# Patient Record
Sex: Female | Born: 1970 | Race: White | Hispanic: No | Marital: Married | Smoking: Never smoker
Health system: Southern US, Community
[De-identification: ages and names within clinical notes are randomized; demographics above are authoritative.]

## PROBLEM LIST (undated history)

## (undated) DIAGNOSIS — M069 Rheumatoid arthritis, unspecified: Secondary | ICD-10-CM

## (undated) DIAGNOSIS — E669 Obesity, unspecified: Secondary | ICD-10-CM

## (undated) DIAGNOSIS — E785 Hyperlipidemia, unspecified: Secondary | ICD-10-CM

## (undated) DIAGNOSIS — I10 Essential (primary) hypertension: Secondary | ICD-10-CM

## (undated) DIAGNOSIS — K219 Gastro-esophageal reflux disease without esophagitis: Secondary | ICD-10-CM

## (undated) HISTORY — DX: Essential (primary) hypertension: I10

## (undated) HISTORY — PX: OTHER SURGICAL HISTORY: SHX169

## (undated) HISTORY — PX: TONSILLECTOMY: SUR1361

## (undated) HISTORY — DX: Hyperlipidemia, unspecified: E78.5

## (undated) HISTORY — DX: Obesity, unspecified: E66.9

## (undated) HISTORY — DX: Rheumatoid arthritis, unspecified: M06.9

---

## 2013-03-09 ENCOUNTER — Ambulatory Visit: Payer: Self-pay | Admitting: Adult Health

## 2013-10-13 ENCOUNTER — Ambulatory Visit: Payer: Self-pay | Admitting: Family Medicine

## 2013-11-27 DIAGNOSIS — K219 Gastro-esophageal reflux disease without esophagitis: Secondary | ICD-10-CM | POA: Insufficient documentation

## 2013-12-07 ENCOUNTER — Ambulatory Visit: Payer: Self-pay | Admitting: Podiatry

## 2013-12-11 ENCOUNTER — Ambulatory Visit: Payer: Self-pay | Admitting: Podiatry

## 2014-02-12 DIAGNOSIS — H353190 Nonexudative age-related macular degeneration, unspecified eye, stage unspecified: Secondary | ICD-10-CM | POA: Insufficient documentation

## 2014-07-19 LAB — HM PAP SMEAR: HM PAP: NORMAL

## 2014-12-30 ENCOUNTER — Other Ambulatory Visit: Payer: Self-pay | Admitting: Family Medicine

## 2014-12-30 ENCOUNTER — Other Ambulatory Visit: Payer: Self-pay | Admitting: Obstetrics and Gynecology

## 2014-12-30 DIAGNOSIS — Z1231 Encounter for screening mammogram for malignant neoplasm of breast: Secondary | ICD-10-CM

## 2015-01-05 ENCOUNTER — Ambulatory Visit
Admission: RE | Admit: 2015-01-05 | Discharge: 2015-01-05 | Disposition: A | Discharge status: Home / Self Care | Payer: Managed Care, Other (non HMO) | Source: Ambulatory Visit | Attending: Obstetrics and Gynecology | Admitting: Obstetrics and Gynecology

## 2015-01-05 DIAGNOSIS — Z1231 Encounter for screening mammogram for malignant neoplasm of breast: Secondary | ICD-10-CM

## 2015-04-19 ENCOUNTER — Encounter: Payer: Self-pay | Admitting: Obstetrics and Gynecology

## 2015-04-19 ENCOUNTER — Ambulatory Visit (INDEPENDENT_AMBULATORY_CARE_PROVIDER_SITE_OTHER): Payer: Managed Care, Other (non HMO) | Admitting: Obstetrics and Gynecology

## 2015-04-19 MED ORDER — CYANOCOBALAMIN 1000 MCG/ML IJ SOLN: 1000.0000 ug | Freq: Once | INTRAMUSCULAR | Status: DC

## 2015-04-19 MED ORDER — PHENTERMINE HCL 37.5 MG PO TABS: 37.5000 mg | ORAL_TABLET | Freq: Every day | ORAL | Status: DC

## 2015-04-19 NOTE — Progress Notes (Signed)
Patient ID: Victoria Berry, female   DOB: 07-15-1970, 44 y.o.   MRN: [id number]  Subjective:  Victoria Berry is a 44 y.o. G1P0 at Unknown being seen today for weight loss management- initial visit.  Patient reports General ROS: fatigue secondary to RA and reports previous weight loss attempts: weight watchers and exercise (has gym membership)  The following portions of the patient's history were reviewed and updated as appropriate: allergies, current medications, past family history, past medical history, past social history, past surgical history and problem list.   Objective:   Filed Vitals:   04/19/15 0917  BP: 132/83  Pulse: 84  Height: 5' (1.524 m)  Weight: 248 lb 6.4 oz (112.674 kg)    General:  Alert, oriented and cooperative. Patient is in no acute distress.  :   :   :   :   :   :   PE: Well groomed female in no current distress,   Mental Status: Normal mood and affect. Normal behavior. Normal judgment and thought content.   Current BMI: Body mass index is 48.51 kg/(m^2).   Assessment and Plan:  Obesity  There are no diagnoses linked to this encounter.  Plan: low carb, High protein diet RX for adipex 37.5 mg daily and B12 .ml monthly, to start now with first injection given at today's visit. Reviewed side-effects Berry to both medications and expected outcomes. Increase daily water intake to at least 8 bottle a day, every day.  Goal is to reduse weight by 10% by end of three months, and will re-evaluate then.  RTC in 4 weeks for Nurse visit to check weight & BP, and get next B12 injections.    Please refer to After Visit Summary for other counseling recommendations.    Camani Sesay Suzan Nailer, CNM    Consider the Low Glycemic Index Diet and 6 smaller meals daily .  This boosts your metabolism and regulates your sugars:   Use the protein bar by [employer] because they have lots of fiber in them  Find the low carb flatbreads, tortillas  and pita breads for sandwiches:  Joseph's makes a pita bread and a flat bread , available at [facility] and [employer]; Toufayah makes a low carb flatbread available at [employer] and [employer] that is 9 net carbs and 100 cal Mission makes a low carb whole wheat tortilla available at [employer] most grocery stores with 6 net carbs and 210 cal  [place] yogurt can still have a lot of carbs .  Dannon Light N fit has 80 cal and 8 carbs

## 2015-04-19 NOTE — Patient Instructions (Signed)

## 2015-04-20 DIAGNOSIS — E669 Obesity, unspecified: Secondary | ICD-10-CM | POA: Insufficient documentation

## 2015-04-20 DIAGNOSIS — M0609 Rheumatoid arthritis without rheumatoid factor, multiple sites: Secondary | ICD-10-CM | POA: Insufficient documentation

## 2015-04-20 DIAGNOSIS — Z79899 Other long term (current) drug therapy: Secondary | ICD-10-CM | POA: Insufficient documentation

## 2015-05-20 ENCOUNTER — Ambulatory Visit (INDEPENDENT_AMBULATORY_CARE_PROVIDER_SITE_OTHER): Payer: Managed Care, Other (non HMO) | Admitting: Obstetrics and Gynecology

## 2015-05-20 VITALS — BP 153/82 | HR 84 | Ht 60.0 in | Wt 238.9 lb

## 2015-05-20 DIAGNOSIS — R5383 Other fatigue: Secondary | ICD-10-CM

## 2015-05-20 DIAGNOSIS — E669 Obesity, unspecified: Secondary | ICD-10-CM

## 2015-05-20 MED ORDER — CYANOCOBALAMIN 1000 MCG/ML IJ SOLN
1000.0000 ug | Freq: Once | INTRAMUSCULAR | Status: AC
  Administered 2015-05-20: 1000 ug via INTRAMUSCULAR

## 2015-05-20 NOTE — Progress Notes (Cosign Needed)
Pt presents for weight management with adipex. Pt is here for BP check and injection of B12. Pt today with a weight loss of 10lbs. Pt states she is exercising 2-3 times per week, although working to increase that. Pt states that she has been eating clean (no dairy or red meat). Pt states she has better energy and is now sleeping well after some initial difficulty falling asleep. Pt does c/o worsening GERD, pt states she is on Nexium currently for reflux. Pt also notes that she is having a colonoscopy and endoscopy on March 3rd and wishes to discontinue the apidpex the week before her procedures. Advised pt that I would inform [facility]. Pt also with elevated BP today, notes that she was rushing to get here. Will monitor.

## 2015-05-20 NOTE — Patient Instructions (Signed)
Pt to f/u in 1 month for wt, bp, b12

## 2015-06-17 ENCOUNTER — Ambulatory Visit (INDEPENDENT_AMBULATORY_CARE_PROVIDER_SITE_OTHER): Payer: Managed Care, Other (non HMO) | Admitting: Obstetrics and Gynecology

## 2015-06-17 VITALS — BP 140/81 | HR 74 | Ht 60.0 in | Wt 236.9 lb

## 2015-06-17 DIAGNOSIS — R5383 Other fatigue: Secondary | ICD-10-CM

## 2015-06-17 DIAGNOSIS — E669 Obesity, unspecified: Secondary | ICD-10-CM | POA: Diagnosis not present

## 2015-06-17 MED ORDER — CYANOCOBALAMIN 1000 MCG/ML IJ SOLN
1000.0000 ug | Freq: Once | INTRAMUSCULAR | Status: AC
  Administered 2015-06-17: 1000 ug via INTRAMUSCULAR

## 2015-06-17 NOTE — Patient Instructions (Signed)
FU in 1 month

## 2015-06-17 NOTE — Progress Notes (Cosign Needed)
Pt presents for Weight, BP, and B12 injection. Pt today with a weight loss of 2lbs. Pt notes that she has only been taking half of the Phentermine tablet, due to her upcoming endoscopy (feels as though the medication may contribute to her not having accurate results from endoscopy). Pt states that the Phentermine has been aggravating her GERD and has made the symptoms worse, pt is hopeful that a half tablet will help with the GERD symptoms but questions if she will still see results only taking half tablet. Pt is schedule for Endoscopy with [facility] 06/24/2015. Pt to return in 1 month, after endoscopy results. Pt given 35mL injection of B12, pt tolerated well.

## 2015-06-23 ENCOUNTER — Encounter: Payer: Self-pay | Admitting: *Deleted

## 2015-06-24 ENCOUNTER — Encounter: Payer: Self-pay | Admitting: *Deleted

## 2015-06-24 ENCOUNTER — Ambulatory Visit
Admission: RE | Admit: 2015-06-24 | Discharge: 2015-06-24 | Disposition: A | Discharge status: Home / Self Care | Payer: Managed Care, Other (non HMO) | Source: Ambulatory Visit | Attending: Gastroenterology | Admitting: Gastroenterology

## 2015-06-24 ENCOUNTER — Ambulatory Visit: Payer: Managed Care, Other (non HMO) | Admitting: Anesthesiology

## 2015-06-24 ENCOUNTER — Encounter
Admission: RE | Disposition: A | Discharge status: Home / Self Care | Payer: Self-pay | Source: Ambulatory Visit | Attending: Gastroenterology

## 2015-06-24 DIAGNOSIS — K2970 Gastritis, unspecified, without bleeding: Secondary | ICD-10-CM | POA: Insufficient documentation

## 2015-06-24 DIAGNOSIS — E669 Obesity, unspecified: Secondary | ICD-10-CM | POA: Insufficient documentation

## 2015-06-24 DIAGNOSIS — Z79899 Other long term (current) drug therapy: Secondary | ICD-10-CM | POA: Diagnosis not present

## 2015-06-24 DIAGNOSIS — K644 Residual hemorrhoidal skin tags: Secondary | ICD-10-CM | POA: Diagnosis not present

## 2015-06-24 DIAGNOSIS — Z1211 Encounter for screening for malignant neoplasm of colon: Secondary | ICD-10-CM | POA: Diagnosis present

## 2015-06-24 DIAGNOSIS — M069 Rheumatoid arthritis, unspecified: Secondary | ICD-10-CM | POA: Diagnosis not present

## 2015-06-24 DIAGNOSIS — K219 Gastro-esophageal reflux disease without esophagitis: Secondary | ICD-10-CM | POA: Diagnosis not present

## 2015-06-24 DIAGNOSIS — Z880 Allergy status to penicillin: Secondary | ICD-10-CM | POA: Diagnosis not present

## 2015-06-24 DIAGNOSIS — M1990 Unspecified osteoarthritis, unspecified site: Secondary | ICD-10-CM | POA: Insufficient documentation

## 2015-06-24 DIAGNOSIS — Z8371 Family history of colonic polyps: Secondary | ICD-10-CM | POA: Diagnosis not present

## 2015-06-24 HISTORY — DX: Gastro-esophageal reflux disease without esophagitis: K21.9

## 2015-06-24 HISTORY — PX: COLONOSCOPY WITH PROPOFOL: SHX5780

## 2015-06-24 HISTORY — PX: ESOPHAGOGASTRODUODENOSCOPY (EGD) WITH PROPOFOL: SHX5813

## 2015-06-24 SURGERY — COLONOSCOPY WITH PROPOFOL
Anesthesia: General

## 2015-06-24 MED ORDER — PROPOFOL 10 MG/ML IV BOLUS
INTRAVENOUS | Status: DC | PRN
  Administered 2015-06-24: 100 mg via INTRAVENOUS

## 2015-06-24 MED ORDER — FENTANYL CITRATE (PF) 100 MCG/2ML IJ SOLN
INTRAMUSCULAR | Status: DC | PRN
  Administered 2015-06-24: 50 ug via INTRAVENOUS

## 2015-06-24 MED ORDER — LIDOCAINE HCL (CARDIAC) 20 MG/ML IV SOLN
INTRAVENOUS | Status: DC | PRN
  Administered 2015-06-24: 40 mg via INTRAVENOUS

## 2015-06-24 MED ORDER — PROPOFOL 500 MG/50ML IV EMUL
INTRAVENOUS | Status: DC | PRN
  Administered 2015-06-24: 175 ug/kg/min via INTRAVENOUS

## 2015-06-24 MED ORDER — MIDAZOLAM HCL 2 MG/2ML IJ SOLN
INTRAMUSCULAR | Status: DC | PRN
  Administered 2015-06-24: 1 mg via INTRAVENOUS

## 2015-06-24 MED ORDER — SODIUM CHLORIDE 0.9 % IV SOLN
INTRAVENOUS | Status: DC
  Administered 2015-06-24: 1000 mL via INTRAVENOUS

## 2015-06-24 NOTE — H&P (Signed)
  Primary Care Physician:  WHITE, Arlyss Repress, NP  Pre-Procedure History & Physical: HPI:  Victoria Berry is a 45 y.o. female is here for an endoscopy / colonoscopy   Past Medical History  Diagnosis Date  . RA (rheumatoid arthritis) (HCC)   . Obesity   . GERD (gastroesophageal reflux disease)     Past Surgical History  Procedure Laterality Date  . Cesarean section  2000    twins  . Tonsillectomy    . Right ureter      Prior to Admission medications   Medication Sig Start Date End Date Taking? Authorizing Provider  cetirizine (ZYRTEC) 10 MG tablet Take 10 mg by mouth daily. As needed for allergies   Yes Historical Provider, MD  omeprazole (PRILOSEC) 40 MG capsule Take 40 mg by mouth daily.   Yes Historical Provider, MD  cholecalciferol (VITAMIN D) 1000 UNITS tablet Take 1,000 Units by mouth daily.    Historical Provider, MD  cyanocobalamin (,VITAMIN B-12,) 1000 MCG/ML injection Inject 1 mL (1,000 mcg total) into the muscle once. 04/19/15   Melody N Shambley, CNM  esomeprazole (NEXIUM) 40 MG capsule Take 40 mg by mouth daily at 12 noon.    Historical Provider, MD  hydroxychloroquine (PLAQUENIL) 200 MG tablet Take 200 mg by mouth 2 (two) times daily.    Historical Provider, MD  Omega-3 Fatty Acids (FISH OIL) 1000 MG CAPS Take by mouth.    Historical Provider, MD  phentermine (ADIPEX-P) 37.5 MG tablet Take 1 tablet (37.5 mg total) by mouth daily before breakfast. 04/19/15   Melody N Shambley, CNM  polyethylene glycol (MIRALAX / GLYCOLAX) packet Take 17 g by mouth daily.    Historical Provider, MD  polyethylene glycol-electrolytes (NULYTELY/GOLYTELY) 420 g solution Take by mouth. 05/18/15   Historical Provider, MD    Allergies as of 06/15/2015 - Review Complete 05/20/2015  Allergen Reaction Noted  . Penicillins Nausea And Vomiting 04/19/2015    Family History  Problem Relation Age of Onset  . Breast cancer Neg Hx   . Rheum arthritis Mother     Social History    Social History  . Marital Status: Married    Spouse Name: N/A  . Number of Children: N/A  . Years of Education: N/A   Occupational History  . Not on file.   Social History Main Topics  . Smoking status: Never Smoker   . Smokeless tobacco: Never Used  . Alcohol Use: No  . Drug Use: No  . Sexual Activity: Yes    Birth Control/ Protection: Condom   Other Topics Concern  . Not on file   Social History Narrative     Physical Exam: BP 154/83 mmHg  Pulse 86  Temp(Src) 97 F (36.1 C) (Oral)  Resp 16  SpO2 100% General:   Alert,  pleasant and cooperative in NAD Head:  Normocephalic and atraumatic. Neck:  Supple; no masses or thyromegaly. Lungs:  Clear throughout to auscultation.    Heart:  Regular rate and rhythm. Abdomen:  Soft, nontender and nondistended. Normal bowel sounds, without guarding, and without rebound.   Neurologic:  Alert and  oriented x4;  grossly normal neurologically.  Impression/Plan: Victoria Berry is here for an endoscopy to be performed for GERD, hx esopohagitis,  Colon for screening, + fam hx polyps  Risks, benefits, limitations, and alternatives regarding  Endoscopy/colonoscopy have been reviewed with the patient.  Questions have been answered.  All parties agreeable.   Elnita Maxwell, MD  06/24/2015, 9:28 AM

## 2015-06-24 NOTE — Anesthesia Postprocedure Evaluation (Signed)
Anesthesia Post Note  Patient: Kailynn Luellen-Conner  Procedure(s) Performed: Procedure(s) (LRB): COLONOSCOPY WITH PROPOFOL (N/A) ESOPHAGOGASTRODUODENOSCOPY (EGD) WITH PROPOFOL (N/A)  Patient location during evaluation: Endoscopy Anesthesia Type: General Level of consciousness: awake and alert Pain management: pain level controlled Vital Signs Assessment: post-procedure vital signs reviewed and stable Respiratory status: spontaneous breathing, nonlabored ventilation, respiratory function stable and patient connected to nasal cannula oxygen Cardiovascular status: blood pressure returned to baseline and stable Postop Assessment: no signs of nausea or vomiting Anesthetic complications: no    Last Vitals:  Filed Vitals:   06/24/15 1030 06/24/15 1040  BP: 111/70 118/65  Pulse: 78 74  Temp:    Resp: 18 17    Last Pain: There were no vitals filed for this visit.               Cleda Mccreedy Shateria Paternostro

## 2015-06-24 NOTE — Discharge Instructions (Signed)

## 2015-06-24 NOTE — Transfer of Care (Signed)
Immediate Anesthesia Transfer of Care Note  Patient: Victoria Berry  Procedure(s) Performed: Procedure(s): COLONOSCOPY WITH PROPOFOL (N/A) ESOPHAGOGASTRODUODENOSCOPY (EGD) WITH PROPOFOL (N/A)  Patient Location: PACU and Endoscopy Unit  Anesthesia Type:General  Level of Consciousness: sedated  Airway & Oxygen Therapy: Patient Spontanous Breathing and Patient connected to nasal cannula oxygen  Post-op Assessment: Report given to RN and Post -op Vital signs reviewed and stable  Post vital signs: Reviewed and stable  Last Vitals:  Filed Vitals:   06/24/15 1014 06/24/15 1015  BP:  108/64  Pulse:  88  Temp: 36.2 C 36.2 C  Resp:  15    Complications: No apparent anesthesia complications

## 2015-06-24 NOTE — Op Note (Signed)
[facility] Gastroenterology Patient Name: Victoria Berry Procedure Date: 06/24/2015 9:23 AM MRN: [id number] Account #: [id number] Date of Birth: 09/22/1970 Admit Type: Outpatient Age: 45 Room: [facility] ENDO ROOM 2 Gender: Female Note Status: Finalized Procedure:            Colonoscopy Indications:          Colon cancer screening in patient at increased risk:                        Family history of 1st-degree relative with colon polyps                        before age 29 years(father), , Last colonoscopy 7 years                        ago Patient Profile:      This is a 45 year old female. Providers:            Rhona Raider. Shelle Iron, MD Referring MD:         Myrene Galas, MD (Referring MD) Medicines:            Propofol per Anesthesia Complications:        No immediate complications. Procedure:            Pre-Anesthesia Assessment:                       - Prior to the procedure, a History and Physical was                        performed, and patient medications, allergies and                        sensitivities were reviewed. The patient's tolerance of                        previous anesthesia was reviewed.                       After obtaining informed consent, the colonoscope was                        passed under direct vision. Throughout the procedure,                        the patient's blood pressure, pulse, and oxygen                        saturations were monitored continuously. The                        Colonoscope was introduced through the anus and                        advanced to the the cecum, identified by appendiceal                        orifice and ileocecal valve. The colonoscopy was                        performed without difficulty. The patient tolerated the  procedure well. The quality of the bowel preparation                        was good. Findings:      The perianal exam findings include non-thrombosed  external hemorrhoids.      The exam was otherwise without abnormality. Impression:           - Non-thrombosed external hemorrhoids found on perianal                        exam.                       - The examination was otherwise normal.                       - No specimens collected. Recommendation:       - Observe patient in GI recovery unit.                       - High fiber diet.                       - Continue present medications.                       - Repeat colonoscopy in 5 years for screening purposes.                       - Return to referring physician.                       - The findings and recommendations were discussed with                        the patient.                       - The findings and recommendations were discussed with                        the patient's family. Procedure Code(s):    --- Professional ---                       [phone], Colonoscopy, flexible; diagnostic, including                        collection of specimen(s) by brushing or washing, when                        performed (separate procedure) Diagnosis Code(s):    --- Professional ---                       Z83.71, Family history of colonic polyps                       K64.4, Residual hemorrhoidal skin tags CPT copyright 2016 American Medical Association. All rights reserved. The codes documented in this report are preliminary and upon coder review may  be revised to meet current compliance requirements. Kathalene Frames, MD 06/24/2015 10:11:02 AM This report has been signed electronically. Number of Addenda: 0 Note Initiated On: 06/24/2015 9:23 AM Scope Withdrawal  Time: 0 hours 11 minutes 52 seconds  Total Procedure Duration: 0 hours 22 minutes 25 seconds       [facility]

## 2015-06-24 NOTE — Anesthesia Preprocedure Evaluation (Signed)
Anesthesia Evaluation  Patient identified by MRN, date of birth, ID band Patient awake    Reviewed: Allergy & Precautions, H&P , NPO status , Patient's Chart, lab work & pertinent test results  History of Anesthesia Complications Negative for: history of anesthetic complications  Airway Mallampati: III  TM Distance: >3 FB Neck ROM: full    Dental  (+) Poor Dentition   Pulmonary neg pulmonary ROS, neg shortness of breath,    Pulmonary exam normal breath sounds clear to auscultation       Cardiovascular Exercise Tolerance: Good (-) angina(-) Past MI and (-) DOE negative cardio ROS Normal cardiovascular exam Rhythm:regular Rate:Normal     Neuro/Psych negative neurological ROS  negative psych ROS   GI/Hepatic Neg liver ROS, GERD  Controlled,  Endo/Other  negative endocrine ROS  Renal/GU negative Renal ROS  negative genitourinary   Musculoskeletal  (+) Arthritis ,   Abdominal   Peds negative pediatric ROS (+)  Hematology negative hematology ROS (+)   Anesthesia Other Findings Past Medical History:   RA (rheumatoid arthritis) (HCC)                              Obesity                                                      GERD (gastroesophageal reflux disease)                      Past Surgical History:   CESAREAN SECTION                                 2000           Comment:twins   TONSILLECTOMY                                                 right ureter                                                    Reproductive/Obstetrics negative OB ROS                             Anesthesia Physical Anesthesia Plan  ASA: III  Anesthesia Plan: General   Post-op Pain Management:    Induction:   Airway Management Planned:   Additional Equipment:   Intra-op Plan:   Post-operative Plan:   Informed Consent: I have reviewed the patients History and Physical, chart, labs and discussed  the procedure including the risks, benefits and alternatives for the proposed anesthesia with the patient or authorized representative who has indicated his/her understanding and acceptance.   Dental Advisory Given  Plan Discussed with: Anesthesiologist, CRNA and Surgeon  Anesthesia Plan Comments:         Anesthesia Quick Evaluation

## 2015-06-24 NOTE — Op Note (Signed)
[facility] Gastroenterology Patient Name: Victoria Berry Procedure Date: 06/24/2015 9:24 AM MRN: [id number] Account #: [id number] Date of Birth: 08-16-1970 Admit Type: Outpatient Age: 45 Room: [facility] ENDO ROOM 2 Gender: Female Note Status: Finalized Procedure:            Upper GI endoscopy Indications:          Heartburn, Follow-up of reflux esophagitis Patient Profile:      This is a 45 year old female. Providers:            Rhona Raider. Shelle Iron, MD Referring MD:         Courtney Paris. White, MD (Referring MD) Medicines:            Propofol per Anesthesia Complications:        No immediate complications. Estimated blood loss:                        Minimal. Procedure:            Pre-Anesthesia Assessment:                       - Prior to the procedure, a History and Physical was                        performed, and patient medications, allergies and                        sensitivities were reviewed. The patient's tolerance of                        previous anesthesia was reviewed.                       After obtaining informed consent, the endoscope was                        passed under direct vision. Throughout the procedure,                        the patient's blood pressure, pulse, and oxygen                        saturations were monitored continuously. The Endoscope                        was introduced through the mouth, and advanced to the                        second part of duodenum. The upper GI endoscopy was                        accomplished without difficulty. The patient tolerated                        the procedure well. Findings:      The Z-line was irregular. Biopsies were taken with a cold forceps for       histology. Estimated blood loss was minimal.      The stomach was normal.      The examined duodenum was normal. Impression:           - Z-line irregular. Biopsied.                       -  Normal stomach.  - Normal examined duodenum. Recommendation:       - Perform a colonoscopy today.                       - Continue present medications.                       - Await pathology results.                       - The findings and recommendations were discussed with                        the patient.                       - The findings and recommendations were discussed with                        the patient's family. Procedure Code(s):    --- Professional ---                       [phone], Esophagogastroduodenoscopy, flexible, transoral;                        with biopsy, single or multiple Diagnosis Code(s):    --- Professional ---                       K22.8, Other specified diseases of esophagus                       R12, Heartburn                       K21.0, Gastro-esophageal reflux disease with esophagitis CPT copyright 2016 American Medical Association. All rights reserved. The codes documented in this report are preliminary and upon coder review may  be revised to meet current compliance requirements. Kathalene Frames, MD 06/24/2015 9:43:44 AM This report has been signed electronically. Number of Addenda: 0 Note Initiated On: 06/24/2015 9:24 AM      [facility]

## 2015-06-24 NOTE — Anesthesia Procedure Notes (Signed)
Date/Time: 06/24/2015 9:29 AM Performed by: Stormy Fabian Pre-anesthesia Checklist: Patient identified, Emergency Drugs available, Suction available and Patient being monitored Patient Re-evaluated:Patient Re-evaluated prior to inductionOxygen Delivery Method: Nasal cannula Intubation Type: IV induction Dental Injury: Teeth and Oropharynx as per pre-operative assessment  Comments: Nasal cannula with etCO2 monitoring

## 2015-06-27 ENCOUNTER — Encounter: Payer: Self-pay | Admitting: Gastroenterology

## 2015-06-27 LAB — SURGICAL PATHOLOGY

## 2015-07-20 ENCOUNTER — Ambulatory Visit (INDEPENDENT_AMBULATORY_CARE_PROVIDER_SITE_OTHER): Payer: Managed Care, Other (non HMO) | Admitting: Obstetrics and Gynecology

## 2015-07-20 VITALS — BP 125/75 | HR 67 | Wt 229.3 lb

## 2015-07-20 DIAGNOSIS — R7982 Elevated C-reactive protein (CRP): Secondary | ICD-10-CM | POA: Insufficient documentation

## 2015-07-20 DIAGNOSIS — E669 Obesity, unspecified: Secondary | ICD-10-CM

## 2015-07-20 MED ORDER — CYANOCOBALAMIN 1000 MCG/ML IJ SOLN
1000.0000 ug | Freq: Once | INTRAMUSCULAR | Status: AC
  Administered 2015-07-20: 1000 ug via INTRAMUSCULAR

## 2015-07-20 NOTE — Progress Notes (Signed)
Patient ID: Victoria Berry, female   DOB: 12-Dec-1970, 45 y.o.   MRN: [id number] Pt presents for weight, B/P, B-12 injection. No side effects of medication-Phentermine, or B-12.  Weight loss of 7 lbs. Encouraged eating healthy and exercise. Pt will see provider next visit. She has been only taking 1/2 tab and some days none.

## 2015-08-24 ENCOUNTER — Ambulatory Visit: Payer: Managed Care, Other (non HMO) | Admitting: Obstetrics and Gynecology

## 2015-11-07 ENCOUNTER — Other Ambulatory Visit: Payer: Self-pay | Admitting: Unknown Physician Specialty

## 2015-11-07 ENCOUNTER — Ambulatory Visit
Admission: RE | Admit: 2015-11-07 | Discharge: 2015-11-07 | Disposition: A | Discharge status: Home / Self Care | Payer: Managed Care, Other (non HMO) | Source: Ambulatory Visit | Attending: Unknown Physician Specialty | Admitting: Unknown Physician Specialty

## 2015-11-07 DIAGNOSIS — R1011 Right upper quadrant pain: Secondary | ICD-10-CM

## 2015-11-07 DIAGNOSIS — N281 Cyst of kidney, acquired: Secondary | ICD-10-CM | POA: Diagnosis not present

## 2015-11-09 ENCOUNTER — Ambulatory Visit
Admission: RE | Admit: 2015-11-09 | Discharge: 2015-11-09 | Disposition: A | Discharge status: Home / Self Care | Payer: Managed Care, Other (non HMO) | Source: Ambulatory Visit | Attending: Unknown Physician Specialty | Admitting: Unknown Physician Specialty

## 2015-11-09 ENCOUNTER — Other Ambulatory Visit: Payer: Self-pay | Admitting: Unknown Physician Specialty

## 2015-11-09 DIAGNOSIS — R079 Chest pain, unspecified: Secondary | ICD-10-CM | POA: Diagnosis present

## 2015-11-09 DIAGNOSIS — R918 Other nonspecific abnormal finding of lung field: Secondary | ICD-10-CM | POA: Insufficient documentation

## 2016-02-06 ENCOUNTER — Other Ambulatory Visit: Payer: Self-pay | Admitting: Family Medicine

## 2016-02-06 DIAGNOSIS — Z1231 Encounter for screening mammogram for malignant neoplasm of breast: Secondary | ICD-10-CM

## 2016-03-07 ENCOUNTER — Encounter (INDEPENDENT_AMBULATORY_CARE_PROVIDER_SITE_OTHER): Payer: Self-pay

## 2016-03-07 ENCOUNTER — Ambulatory Visit
Admission: RE | Admit: 2016-03-07 | Discharge: 2016-03-07 | Disposition: A | Discharge status: Home / Self Care | Payer: Managed Care, Other (non HMO) | Source: Ambulatory Visit | Attending: Family Medicine | Admitting: Family Medicine

## 2016-03-07 DIAGNOSIS — Z1231 Encounter for screening mammogram for malignant neoplasm of breast: Secondary | ICD-10-CM | POA: Insufficient documentation

## 2016-04-25 DIAGNOSIS — G8929 Other chronic pain: Secondary | ICD-10-CM | POA: Insufficient documentation

## 2016-09-24 ENCOUNTER — Ambulatory Visit: Payer: Self-pay | Admitting: Obstetrics and Gynecology

## 2016-10-25 DIAGNOSIS — M216X1 Other acquired deformities of right foot: Secondary | ICD-10-CM | POA: Insufficient documentation

## 2016-11-07 ENCOUNTER — Ambulatory Visit: Payer: Self-pay | Admitting: Obstetrics and Gynecology

## 2017-01-16 ENCOUNTER — Ambulatory Visit: Payer: Self-pay | Admitting: Obstetrics and Gynecology

## 2017-01-16 DIAGNOSIS — F411 Generalized anxiety disorder: Secondary | ICD-10-CM | POA: Insufficient documentation

## 2017-01-16 DIAGNOSIS — F329 Major depressive disorder, single episode, unspecified: Secondary | ICD-10-CM | POA: Insufficient documentation

## 2017-02-25 ENCOUNTER — Other Ambulatory Visit: Payer: Self-pay | Admitting: Family Medicine

## 2017-02-25 DIAGNOSIS — Z1231 Encounter for screening mammogram for malignant neoplasm of breast: Secondary | ICD-10-CM

## 2017-03-13 ENCOUNTER — Ambulatory Visit
Admission: RE | Admit: 2017-03-13 | Discharge: 2017-03-13 | Disposition: A | Discharge status: Home / Self Care | Payer: Managed Care, Other (non HMO) | Source: Ambulatory Visit | Attending: Family Medicine | Admitting: Family Medicine

## 2017-03-13 DIAGNOSIS — Z1231 Encounter for screening mammogram for malignant neoplasm of breast: Secondary | ICD-10-CM | POA: Insufficient documentation

## 2017-06-05 ENCOUNTER — Ambulatory Visit (INDEPENDENT_AMBULATORY_CARE_PROVIDER_SITE_OTHER): Payer: 59 | Admitting: Obstetrics and Gynecology

## 2017-06-05 ENCOUNTER — Encounter: Payer: Self-pay | Admitting: Obstetrics and Gynecology

## 2017-06-05 ENCOUNTER — Other Ambulatory Visit: Payer: Self-pay | Admitting: Otolaryngology

## 2017-06-05 VITALS — BP 128/84 | Ht 60.0 in | Wt 230.0 lb

## 2017-06-05 DIAGNOSIS — Z1231 Encounter for screening mammogram for malignant neoplasm of breast: Secondary | ICD-10-CM

## 2017-06-05 DIAGNOSIS — H93A1 Pulsatile tinnitus, right ear: Secondary | ICD-10-CM

## 2017-06-05 DIAGNOSIS — Z01419 Encounter for gynecological examination (general) (routine) without abnormal findings: Secondary | ICD-10-CM

## 2017-06-05 DIAGNOSIS — Z124 Encounter for screening for malignant neoplasm of cervix: Secondary | ICD-10-CM | POA: Diagnosis not present

## 2017-06-05 DIAGNOSIS — Z1239 Encounter for other screening for malignant neoplasm of breast: Secondary | ICD-10-CM

## 2017-06-05 DIAGNOSIS — Z1151 Encounter for screening for human papillomavirus (HPV): Secondary | ICD-10-CM | POA: Diagnosis not present

## 2017-06-05 NOTE — Patient Instructions (Signed)
I value your feedback and entrusting us with your care. If you get a [facility] patient survey, I would appreciate you taking the time to let us know about your experience today. Thank you! 

## 2017-06-05 NOTE — Progress Notes (Signed)
PCP:  Victoria Mould, NP   Chief Complaint  Patient presents with  . Annual Exam     HPI:      [name] is a 47 y.o. G1P0 who LMP was Patient's last menstrual period was 05/25/2017., presents today for her annual examination.  Her menses are Q21-28 days, lasting 7-10 days, spotting at the end.  Dysmenorrhea none. She does not have intermenstrual bleeding.  Sex activity: single partner, contraception - condoms , declines other BC Last Pap: July 19, 2014  Results were: no abnormalities /neg HPV DNA  Hx of STDs: none  Last mammogram: March 13, 2017  Results were: normal--routine follow-up in 12 months There is no FH of breast cancer. There is no FH of ovarian cancer. The patient does do self-breast exams.  Tobacco use: The patient denies current or previous tobacco use. Alcohol use: none No drug use.  Exercise: not active  She does get adequate calcium and Vitamin D in her diet.  Labs with PCP   Past Medical History:  Diagnosis Date  . GERD (gastroesophageal reflux disease)   . Obesity   . RA (rheumatoid arthritis) (HCC)     Past Surgical History:  Procedure Laterality Date  . CESAREAN SECTION  2000   twins  . COLONOSCOPY WITH PROPOFOL N/A 06/24/2015   Procedure: COLONOSCOPY WITH PROPOFOL;  Surgeon: Victoria Maxwell, MD;  Location: [facility] ENDOSCOPY;  Service: Endoscopy;  Laterality: N/A;  . ESOPHAGOGASTRODUODENOSCOPY (EGD) WITH PROPOFOL N/A 06/24/2015   Procedure: ESOPHAGOGASTRODUODENOSCOPY (EGD) WITH PROPOFOL;  Surgeon: Victoria Maxwell, MD;  Location: [facility] ENDOSCOPY;  Service: Endoscopy;  Laterality: N/A;  . right ureter    . TONSILLECTOMY      Family History  Problem Relation Age of Onset  . Rheum arthritis Mother   . Lung cancer Paternal Grandfather   . Breast cancer Neg Hx     Social History   Socioeconomic History  . Marital status: Married    Spouse name: Not on file  . Number of children: Not on file  . Years of  education: Not on file  . Highest education level: Not on file  Social Needs  . Financial resource strain: Not on file  . Food insecurity - worry: Not on file  . Food insecurity - inability: Not on file  . Transportation needs - medical: Not on file  . Transportation needs - non-medical: Not on file  Occupational History  . Not on file  Tobacco Use  . Smoking status: Never Smoker  . Smokeless tobacco: Never Used  Substance and Sexual Activity  . Alcohol use: No  . Drug use: No  . Sexual activity: Yes    Birth control/protection: Condom  Other Topics Concern  . Not on file  Social History Narrative  . Not on file    Current Meds  Medication Sig  . cholecalciferol (VITAMIN D) 1000 UNITS tablet Take 1,000 Units by mouth daily.  . cyanocobalamin (,VITAMIN B-12,) 1000 MCG/ML injection Inject 1 mL (1,000 mcg total) into the muscle once.  . hydroxychloroquine (PLAQUENIL) 200 MG tablet Take 200 mg by mouth 2 (two) times daily.  Marland Kitchen IRON-FOLIC ACID PO Take by mouth.  . polyethylene glycol (MIRALAX / GLYCOLAX) packet Take 17 g by mouth daily.  . Probiotic Product (PROBIOTIC-10 PO) Take by mouth.     ROS:  Review of Systems  Constitutional: Negative for fatigue, fever and unexpected weight change.  Respiratory: Negative for cough, shortness of breath and wheezing.  Cardiovascular: Negative for chest pain, palpitations and leg swelling.  Gastrointestinal: Negative for blood in stool, constipation, diarrhea, nausea and vomiting.  Endocrine: Negative for cold intolerance, heat intolerance and polyuria.  Genitourinary: Positive for urgency. Negative for dyspareunia, dysuria, flank pain, frequency, genital sores, hematuria, menstrual problem, pelvic pain, vaginal bleeding, vaginal discharge and vaginal pain.  Musculoskeletal: Negative for back pain, joint swelling and myalgias.  Skin: Negative for rash.  Neurological: Negative for dizziness, syncope, light-headedness, numbness and  headaches.  Hematological: Negative for adenopathy.  Psychiatric/Behavioral: Negative for agitation, confusion, sleep disturbance and suicidal ideas. The patient is not nervous/anxious.    Objective: BP 128/84   Ht 5' (1.524 m)   Wt 230 lb (104.3 kg)   LMP 05/25/2017   BMI 44.92 kg/m   Physical Exam  Constitutional: She is oriented to person, place, and time. She appears well-developed and well-nourished.  Genitourinary: Vagina normal and uterus normal. There is no rash or tenderness on the right labia. There is no rash or tenderness on the left labia. No erythema or tenderness in the vagina. No vaginal discharge found. Right adnexum does not display mass and does not display tenderness. Left adnexum does not display mass and does not display tenderness. Cervix does not exhibit motion tenderness or polyp. Uterus is not enlarged or tender.  Neck: Normal range of motion. No thyromegaly present.  Cardiovascular: Normal rate, regular rhythm and normal heart sounds.  No murmur heard. Pulmonary/Chest: Effort normal and breath sounds normal. Right breast exhibits no mass, no nipple discharge, no skin change and no tenderness. Left breast exhibits no mass, no nipple discharge, no skin change and no tenderness.  Abdominal: Soft. There is no tenderness. There is no guarding.  Musculoskeletal: Normal range of motion.  Neurological: She is alert and oriented to person, place, and time. No cranial nerve deficit.  Psychiatric: She has a normal mood and affect. Her behavior is normal.  Vitals reviewed.   Assessment/Plan: Encounter for annual routine gynecological examination  Cervical cancer screening - Plan: IGP, Aptima HPV  Screening for HPV (human papillomavirus) - Plan: IGP, Aptima HPV  Screening for breast cancer - Pt current on mammo till 11/19. - Plan: MM DIGITAL SCREENING BILATERAL, CANCELED: MM DIGITAL SCREENING BILATERAL           GYN counsel breast self exam, mammography screening,  adequate intake of calcium and vitamin D, diet and exercise     F/U  Return in about 1 year (around 06/05/2018).  Victoria Berry B. Deshauna Cayson, PA-C 06/05/2017 9:05 AM

## 2017-06-08 LAB — IGP, APTIMA HPV
HPV Aptima: NEGATIVE
PAP Smear Comment: 0

## 2017-06-19 ENCOUNTER — Ambulatory Visit: Payer: Managed Care, Other (non HMO)

## 2017-12-25 DIAGNOSIS — H93A1 Pulsatile tinnitus, right ear: Secondary | ICD-10-CM | POA: Insufficient documentation

## 2018-02-26 ENCOUNTER — Encounter: Payer: Self-pay | Admitting: Obstetrics & Gynecology

## 2018-02-26 ENCOUNTER — Ambulatory Visit: Payer: 59 | Admitting: Obstetrics & Gynecology

## 2018-02-26 VITALS — BP 120/80 | Ht 60.0 in | Wt 232.0 lb

## 2018-02-26 DIAGNOSIS — N926 Irregular menstruation, unspecified: Secondary | ICD-10-CM

## 2018-02-26 NOTE — Progress Notes (Signed)
Obstetric Problem Visit   Chief Complaint: Irreg Bleeding  History of Present Illness: Patient is a 47 y.o. [id number] Unknown presenting for first trimester bleeding.  The onset of bleeding was delayed after her usual reg time for period.  She also took a home uPT test that was pos twice (10/25).  She went to doc office in [facility] and noted neg serum hCG test (10/31).  Then she started bleeding.  Rechecked uPT at home and was neg.  Usu reg cycles every 28 days.  Condoms for 19 years for birth control. No pain, nausea, other sx's.  PMHx: She  has a past medical history of GERD (gastroesophageal reflux disease), Hypertension, Obesity, and RA (rheumatoid arthritis) (HCC). Also,  has a past surgical history that includes Cesarean section (2000); Tonsillectomy; right ureter; Colonoscopy with propofol (N/A, 06/24/2015); and Esophagogastroduodenoscopy (egd) with propofol (N/A, 06/24/2015)., family history includes Cancer in her father; Lung cancer in her paternal grandfather; Rheum arthritis in her mother.,  reports that she has never smoked. She has never used smokeless tobacco. She reports that she does not drink alcohol or use drugs.  She has a current medication list which includes the following prescription(s): cetirizine, cholecalciferol, cyanocobalamin, esomeprazole, hydroxychloroquine, iron-folic acid, fish oil, omeprazole, phentermine, polyethylene glycol, polyethylene glycol-electrolytes, and probiotic product. Also, is allergic to penicillins.  Review of Systems  Constitutional: Negative for chills, fever and malaise/fatigue.  HENT: Negative for congestion, sinus pain and sore throat.   Eyes: Negative for blurred vision and pain.  Respiratory: Negative for cough and wheezing.   Cardiovascular: Negative for chest pain and leg swelling.  Gastrointestinal: Negative for abdominal pain, constipation, diarrhea, heartburn, nausea and vomiting.  Genitourinary: Negative for dysuria, frequency, hematuria and  urgency.  Musculoskeletal: Negative for back pain, joint pain, myalgias and neck pain.  Skin: Negative for itching and rash.  Neurological: Negative for dizziness, tremors and weakness.  Endo/Heme/Allergies: Does not bruise/bleed easily.  Psychiatric/Behavioral: Negative for depression. The patient is not nervous/anxious and does not have insomnia.     Objective: Vitals:   02/26/18 1447  BP: 120/80   Physical Exam  Constitutional: She is oriented to person, place, and time. She appears well-developed and well-nourished. No distress.  Musculoskeletal: Normal range of motion.  Neurological: She is alert and oriented to person, place, and time.  Skin: Skin is warm and dry.  Psychiatric: She has a normal mood and affect.  Vitals reviewed.   Assessment: 47 y.o. G1P0002 Unknown 1. Irregular menses - Beta hCG quant (ref lab) - FSH/LH - Estradiol   Plan:  1) First trimester bleeding LIKELY ALREADY COMPLETED MISCARRIAGE - incidence and clinical course of first trimester bleeding is discussed in detail with the patient today.  Approximately 1/3 of pregnancies ending in live births experienced 1st trimester bleeding.  The amount of bleeding is variable and not necessarily predictive of outcome.  Sources may be cervical or uterine.  Subchorionic hemorrhages are a frequent concurrent findings on ultrasound and are followed expectantly.  These often absorb or regress spontaneously although risk for expansion and further disruption of the utero-placental interface leading to miscarriage is possible.  There is no clearly documented benefit to limiting or modifying activity and sexual intercourse in altering clinic course of 1st trimester bleeding.    2) Routine bleeding precautions were discussed with the patient prior the conclusion of today's visit.  3) Labs for hCG as well as perimenopause Still needs contraception- continued condom use vs starting a pill/IUD/BTL  Annamarie Major, MD,  Evern Core  [facility], [facility] 02/26/2018  3:38 PM

## 2018-02-27 LAB — BETA HCG QUANT (REF LAB)

## 2018-02-27 LAB — ESTRADIOL: ESTRADIOL: 34.1 pg/mL

## 2018-02-27 LAB — FSH/LH
FSH: 27.8 m[IU]/mL
LH: 11 m[IU]/mL

## 2018-02-27 NOTE — Progress Notes (Signed)
Cancelled OB appt, not pregnant

## 2018-02-27 NOTE — Progress Notes (Signed)
LM.  Labs to be d/w pt.

## 2018-04-29 ENCOUNTER — Other Ambulatory Visit: Payer: Self-pay | Admitting: Obstetrics and Gynecology

## 2018-04-29 DIAGNOSIS — Z1231 Encounter for screening mammogram for malignant neoplasm of breast: Secondary | ICD-10-CM

## 2018-05-14 ENCOUNTER — Ambulatory Visit: Payer: Managed Care, Other (non HMO)

## 2018-05-21 ENCOUNTER — Ambulatory Visit
Admission: RE | Admit: 2018-05-21 | Discharge: 2018-05-21 | Disposition: A | Discharge status: Home / Self Care | Payer: 59 | Source: Ambulatory Visit | Attending: Obstetrics and Gynecology | Admitting: Obstetrics and Gynecology

## 2018-05-21 ENCOUNTER — Encounter: Payer: Self-pay | Admitting: Obstetrics and Gynecology

## 2018-05-21 DIAGNOSIS — Z1231 Encounter for screening mammogram for malignant neoplasm of breast: Secondary | ICD-10-CM | POA: Insufficient documentation

## 2018-12-24 ENCOUNTER — Encounter: Payer: Self-pay | Admitting: Obstetrics and Gynecology

## 2018-12-24 ENCOUNTER — Ambulatory Visit (INDEPENDENT_AMBULATORY_CARE_PROVIDER_SITE_OTHER): Payer: 59 | Admitting: Obstetrics and Gynecology

## 2018-12-24 ENCOUNTER — Other Ambulatory Visit: Payer: Self-pay

## 2018-12-24 VITALS — BP 130/80 | Ht 60.0 in | Wt 236.0 lb

## 2018-12-24 DIAGNOSIS — N644 Mastodynia: Secondary | ICD-10-CM

## 2018-12-24 DIAGNOSIS — Z1239 Encounter for other screening for malignant neoplasm of breast: Secondary | ICD-10-CM

## 2018-12-24 DIAGNOSIS — Z01419 Encounter for gynecological examination (general) (routine) without abnormal findings: Secondary | ICD-10-CM

## 2018-12-24 NOTE — Patient Instructions (Signed)
I value your feedback and entrusting us with your care. If you get a [facility] patient survey, I would appreciate you taking the time to let us know about your experience today. Thank you! 

## 2018-12-24 NOTE — Progress Notes (Signed)
PCP:  Titus Mould, NP   Chief Complaint  Patient presents with  . Gynecologic Exam    tender breasts      HPI:      [name] is a 48 y.o. G1P0 who LMP was Patient's last menstrual period was 12/18/2018 (exact date)., presents today for her annual examination.  Her menses are Q21-33 days, lasting 5-10 days, spotting at the end.  Dysmenorrhea not usually but did this past cycle. She does not have intermenstrual bleeding. Had Irreg bleeding 11/19 from possible SAB. Occas vasomotor sx.   Sex activity: single partner, contraception - condoms , declines other BC Last Pap: 06/05/17 Results were: no abnormalities /neg HPV DNA  Hx of STDs: hx of abn pap  Last mammogram: 05/21/18  Results were: normal--routine follow-up in 12 months There is no FH of breast cancer. There is no FH of ovarian cancer. The patient does do self-breast exams. Has had bilat breast tenderness recently, often around menses. Thinks she sometimes feels masses that resolve. Drinks caffeine.  Tobacco use: The patient denies current or previous tobacco use. Alcohol use: social No drug use.  Exercise: mod active  She does get adequate calcium and Vitamin D in her diet.  Labs with PCP   Past Medical History:  Diagnosis Date  . GERD (gastroesophageal reflux disease)   . Hypertension   . Obesity   . RA (rheumatoid arthritis) (HCC)     Past Surgical History:  Procedure Laterality Date  . CESAREAN SECTION  2000   twins  . COLONOSCOPY WITH PROPOFOL N/A 06/24/2015   Procedure: COLONOSCOPY WITH PROPOFOL;  Surgeon: Elnita Maxwell, MD;  Location: [facility] ENDOSCOPY;  Service: Endoscopy;  Laterality: N/A;  . ESOPHAGOGASTRODUODENOSCOPY (EGD) WITH PROPOFOL N/A 06/24/2015   Procedure: ESOPHAGOGASTRODUODENOSCOPY (EGD) WITH PROPOFOL;  Surgeon: Elnita Maxwell, MD;  Location: [facility] ENDOSCOPY;  Service: Endoscopy;  Laterality: N/A;  . right ureter    . TONSILLECTOMY      Family History   Problem Relation Age of Onset  . Rheum arthritis Mother   . Lung cancer Paternal Grandfather   . Lung cancer Father 52  . Kidney cancer Maternal Grandmother 57  . Breast cancer Neg Hx     Social History   Socioeconomic History  . Marital status: Married    Spouse name: Not on file  . Number of children: Not on file  . Years of education: Not on file  . Highest education level: Not on file  Occupational History  . Not on file  Social Needs  . Financial resource strain: Not on file  . Food insecurity    Worry: Not on file    Inability: Not on file  . Transportation needs    Medical: Not on file    Non-medical: Not on file  Tobacco Use  . Smoking status: Never Smoker  . Smokeless tobacco: Never Used  Substance and Sexual Activity  . Alcohol use: No  . Drug use: No  . Sexual activity: Yes    Birth control/protection: Condom  Lifestyle  . Physical activity    Days per week: Not on file    Minutes per session: Not on file  . Stress: Not on file  Relationships  . Social Musician on phone: Not on file    Gets together: Not on file    Attends religious service: Not on file    Active member of club or organization: Not on file  Attends meetings of clubs or organizations: Not on file    Relationship status: Not on file  . Intimate partner violence    Fear of current or ex partner: Not on file    Emotionally abused: Not on file    Physically abused: Not on file    Forced sexual activity: Not on file  Other Topics Concern  . Not on file  Social History Narrative  . Not on file    Current Meds  Medication Sig  . cholecalciferol (VITAMIN D) 1000 UNITS tablet Take 1,000 Units by mouth daily.  . hydroxychloroquine (PLAQUENIL) 200 MG tablet Take 200 mg by mouth 2 (two) times daily.  Marland Kitchen IRON-FOLIC ACID PO Take by mouth.  . Multiple Vitamin (MULTI-VITAMIN) tablet Take by mouth.  . Probiotic Product (PROBIOTIC-10 PO) Take by mouth.  . Zinc 50 MG TABS Take by  mouth.     ROS:  Review of Systems  Constitutional: Negative for fatigue, fever and unexpected weight change.  Respiratory: Negative for cough, shortness of breath and wheezing.   Cardiovascular: Negative for chest pain, palpitations and leg swelling.  Gastrointestinal: Negative for blood in stool, constipation, diarrhea, nausea and vomiting.  Endocrine: Negative for cold intolerance, heat intolerance and polyuria.  Genitourinary: Negative for dyspareunia, dysuria, flank pain, frequency, genital sores, hematuria, menstrual problem, pelvic pain, urgency, vaginal bleeding, vaginal discharge and vaginal pain.  Musculoskeletal: Negative for back pain, joint swelling and myalgias.  Skin: Negative for rash.  Neurological: Negative for dizziness, syncope, light-headedness, numbness and headaches.  Hematological: Negative for adenopathy.  Psychiatric/Behavioral: Negative for agitation, confusion, sleep disturbance and suicidal ideas. The patient is not nervous/anxious.   BREAST: tenderness, masses   Objective: BP 130/80   Ht 5' (1.524 m)   Wt 236 lb (107 kg)   LMP 12/18/2018 (Exact Date)   BMI 46.09 kg/m   Physical Exam  Physical Exam Constitutional:      Appearance: She is well-developed.  Genitourinary:     Vulva, vagina, cervix, uterus, right adnexa and left adnexa normal.     No vulval lesion or tenderness noted.     No vaginal discharge, erythema or tenderness.     No cervical polyp.     Uterus is not enlarged or tender.     No right or left adnexal mass present.     Right adnexa not tender.     Left adnexa not tender.  Neck:     Musculoskeletal: Normal range of motion.     Thyroid: No thyromegaly.  Cardiovascular:     Rate and Rhythm: Normal rate and regular rhythm.     Heart sounds: Normal heart sounds. No murmur.  Pulmonary:     Effort: Pulmonary effort is normal.     Breath sounds: Normal breath sounds.  Chest:     Breasts:        Right: No mass, nipple  discharge, skin change or tenderness.        Left: No mass, nipple discharge, skin change or tenderness.     Comments: NEG BREAST EXAM BILAT Abdominal:     Palpations: Abdomen is soft.     Tenderness: There is no abdominal tenderness. There is no guarding.  Musculoskeletal: Normal range of motion.  Neurological:     General: No focal deficit present.     Mental Status: She is alert and oriented to person, place, and time.     Cranial Nerves: No cranial nerve deficit.  Skin:    General: Skin is  warm and dry.  Psychiatric:        Mood and Affect: Mood normal.        Behavior: Behavior normal.        Thought Content: Thought content normal.        Judgment: Judgment normal.  Vitals signs reviewed.     Assessment/Plan: Encounter for annual routine gynecological examination  Screening for breast cancer - Plan: MM 3D SCREEN BREAST BILATERAL--Pt to sched mammo 1/21  Breast tenderness--with menses. D/C caffeine. Neg breast exam. Reassurance. F/u prn.            GYN counsel breast self exam, mammography screening, adequate intake of calcium and vitamin D, diet and exercise     F/U  Return in about 1 year (around 12/24/2019).  Arraya Buck B. Asiel Chrostowski, PA-C 12/24/2018 3:30 PM

## 2019-01-22 ENCOUNTER — Other Ambulatory Visit (HOSPITAL_COMMUNITY): Payer: Self-pay | Admitting: Sports Medicine

## 2019-01-22 ENCOUNTER — Other Ambulatory Visit: Payer: Self-pay | Admitting: Sports Medicine

## 2019-01-22 DIAGNOSIS — G8929 Other chronic pain: Secondary | ICD-10-CM

## 2019-01-22 DIAGNOSIS — M76829 Posterior tibial tendinitis, unspecified leg: Secondary | ICD-10-CM

## 2019-02-04 ENCOUNTER — Ambulatory Visit: Payer: 59

## 2019-02-14 ENCOUNTER — Ambulatory Visit
Admission: RE | Admit: 2019-02-14 | Discharge: 2019-02-14 | Disposition: A | Discharge status: Home / Self Care | Payer: 59 | Source: Ambulatory Visit | Attending: Sports Medicine | Admitting: Sports Medicine

## 2019-02-14 DIAGNOSIS — G8929 Other chronic pain: Secondary | ICD-10-CM | POA: Diagnosis present

## 2019-02-14 DIAGNOSIS — M76829 Posterior tibial tendinitis, unspecified leg: Secondary | ICD-10-CM | POA: Insufficient documentation

## 2019-02-14 DIAGNOSIS — M25571 Pain in right ankle and joints of right foot: Secondary | ICD-10-CM | POA: Insufficient documentation

## 2019-04-22 ENCOUNTER — Other Ambulatory Visit: Payer: Self-pay | Admitting: Family Medicine

## 2019-04-22 DIAGNOSIS — R928 Other abnormal and inconclusive findings on diagnostic imaging of breast: Secondary | ICD-10-CM

## 2019-05-04 ENCOUNTER — Ambulatory Visit
Admission: RE | Admit: 2019-05-04 | Discharge: 2019-05-04 | Disposition: A | Discharge status: Home / Self Care | Payer: 59 | Source: Ambulatory Visit | Attending: Family Medicine | Admitting: Family Medicine

## 2019-05-04 DIAGNOSIS — R928 Other abnormal and inconclusive findings on diagnostic imaging of breast: Secondary | ICD-10-CM

## 2019-06-03 DIAGNOSIS — R002 Palpitations: Secondary | ICD-10-CM | POA: Insufficient documentation

## 2020-02-03 IMAGING — MR MR ANKLE*R* W/O CM
5 series · 40 of 40 positions shown · non-contrast
Comparison: None.

CLINICAL DATA: Chronic right ankle pain for the past 3 years.
History of rheumatoid arthritis.

EXAM:
MRI OF THE RIGHT ANKLE WITHOUT CONTRAST
TECHNIQUE: Multiplanar, multisequence MR imaging of the ankle was performed. No
intravenous contrast was administered.

[Series 3: PD fat-sat · axial · right · 3.0mm · 0.50mm/px · z∈[-114,+26]mm · 10 of 36 slices shown]
[im 1/36]
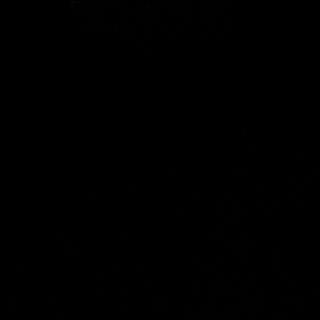
[im 4/36]
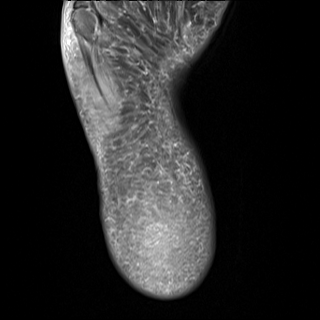
[im 8/36]
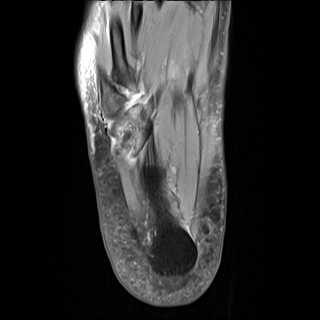
[im 12/36]
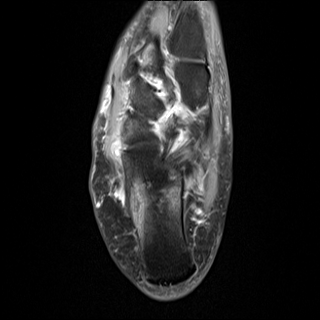
[im 16/36]
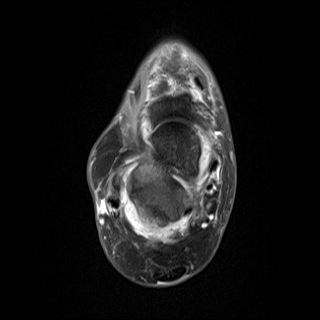
[im 20/36]
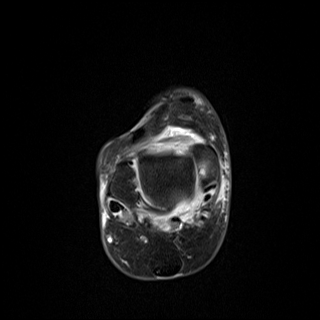
[im 24/36]
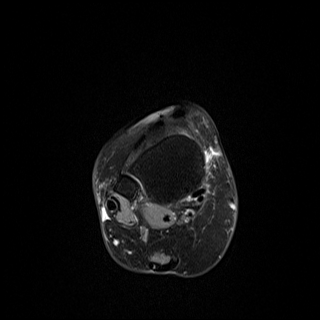
[im 28/36]
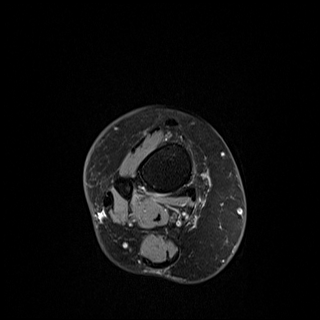
[im 32/36]
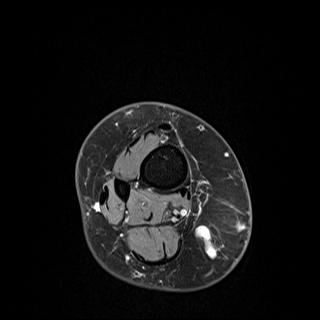
[im 36/36]
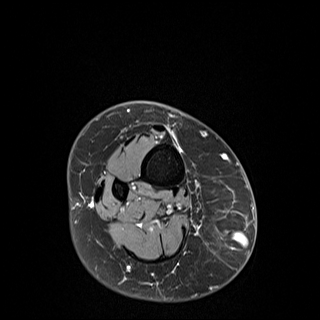

[Series 4: T2 fat-sat · axial · right · 3.0mm · 0.50mm/px · z∈[-114,+26]mm · 10 of 36 slices shown (1 of 2)]
[im 1/36]
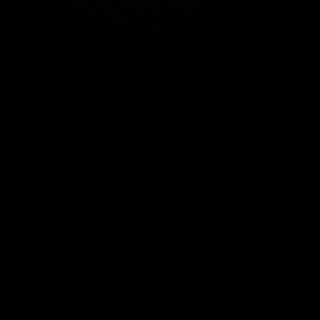
[im 4/36]
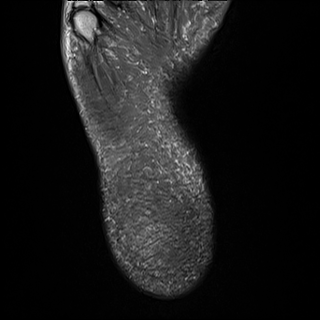
[im 8/36]
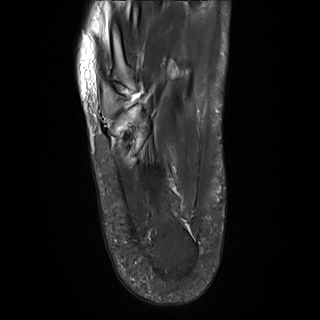
[im 12/36]
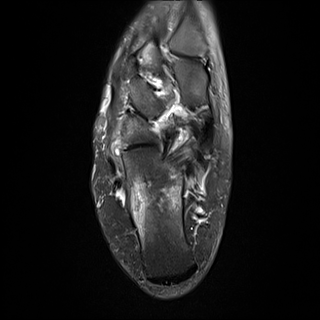
[im 16/36]
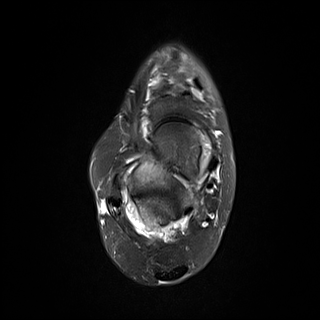
[im 20/36]
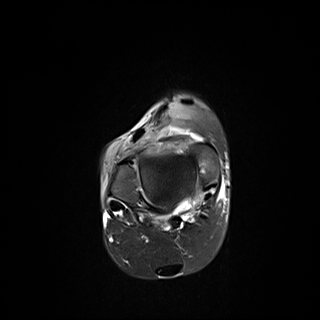
[im 24/36]
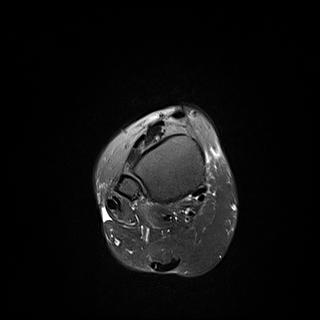
[im 28/36]
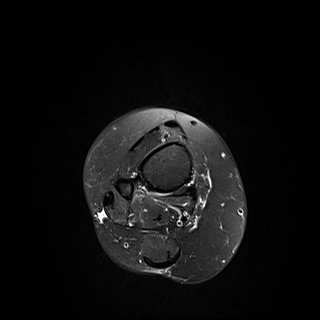
[im 32/36]
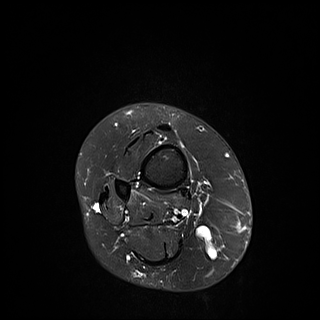
[im 36/36]
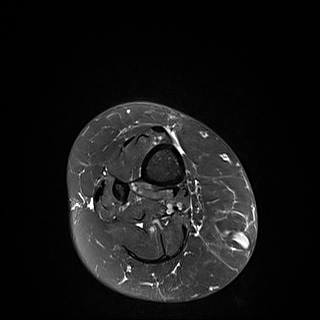

[Series 5: T2 fat-sat · coronal · right · 3.0mm · 0.62mm/px · 10 of 40 slices shown (2 of 2)]
[im 1/40]
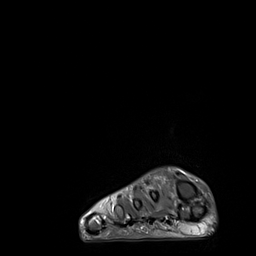
[im 5/40]
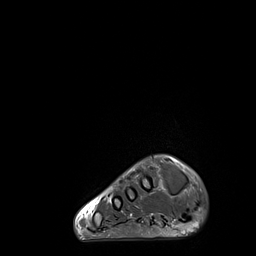
[im 9/40]
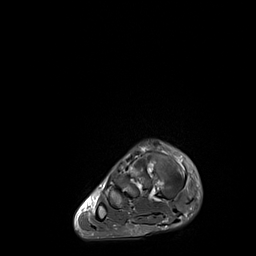
[im 14/40]
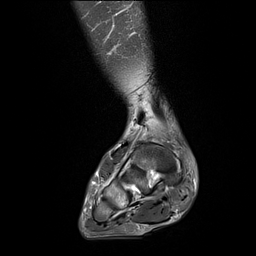
[im 18/40]
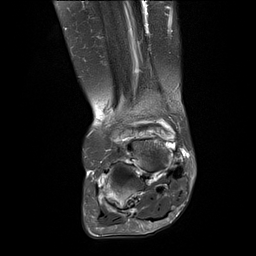
[im 22/40]
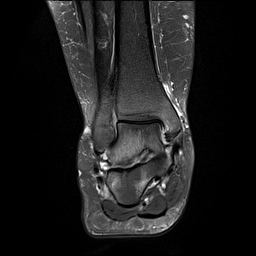
[im 27/40]
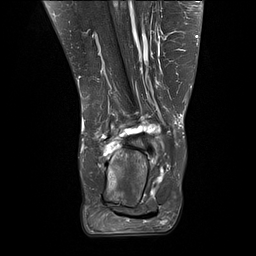
[im 31/40]
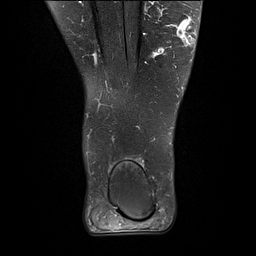
[im 35/40]
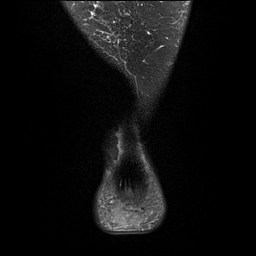
[im 40/40]
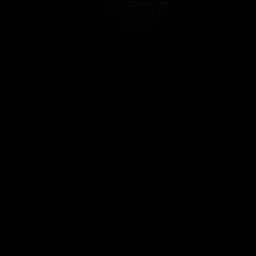

[Series 6: T1 · sagittal · right · 4.0mm · 0.70mm/px · 5 of 21 slices shown]
[im 1/21]
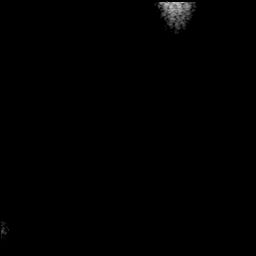
[im 6/21]
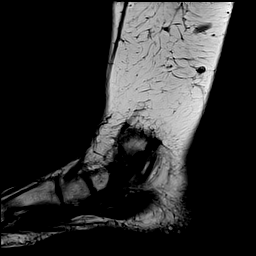
[im 11/21]
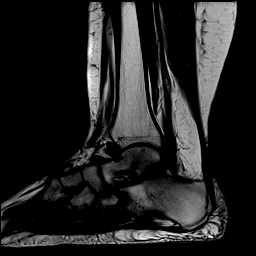
[im 16/21]
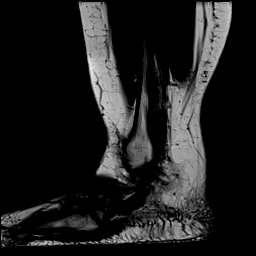
[im 21/21]
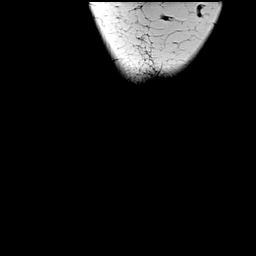

[Series 7: STIR · sagittal · right · 4.0mm · 0.35mm/px · 5 of 21 slices shown]
[im 1/21]
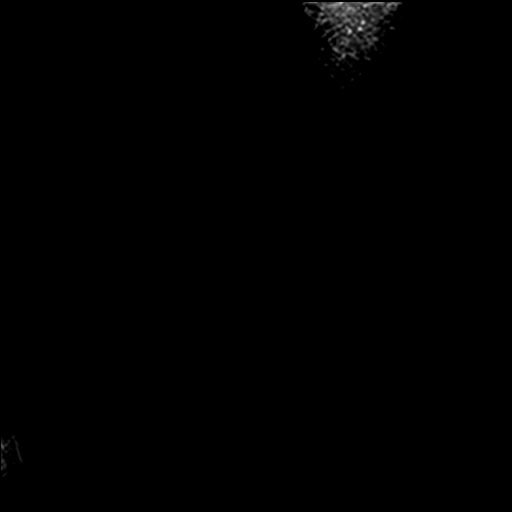
[im 6/21]
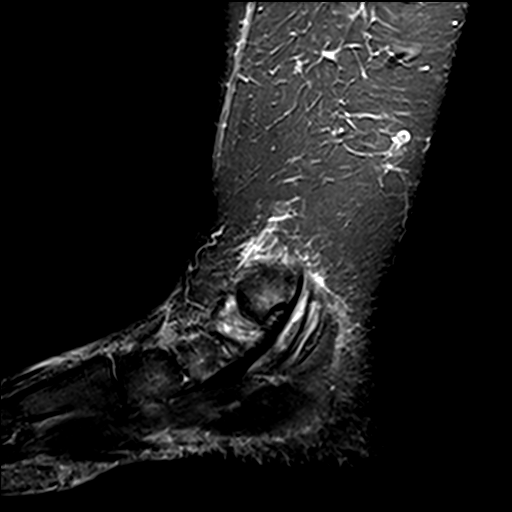
[im 11/21]
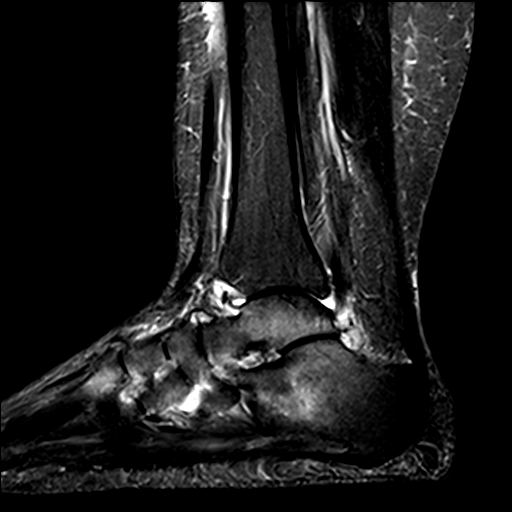
[im 16/21]
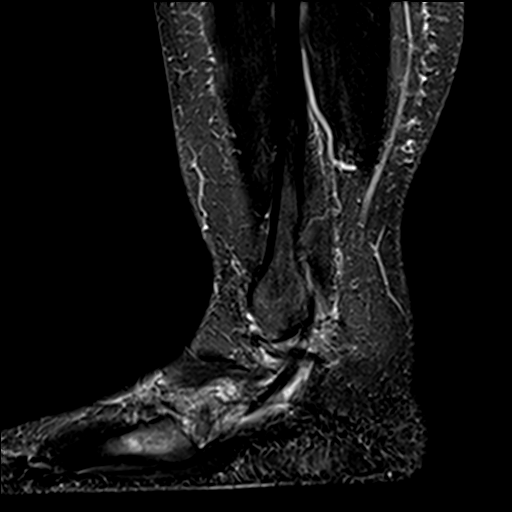
[im 21/21]
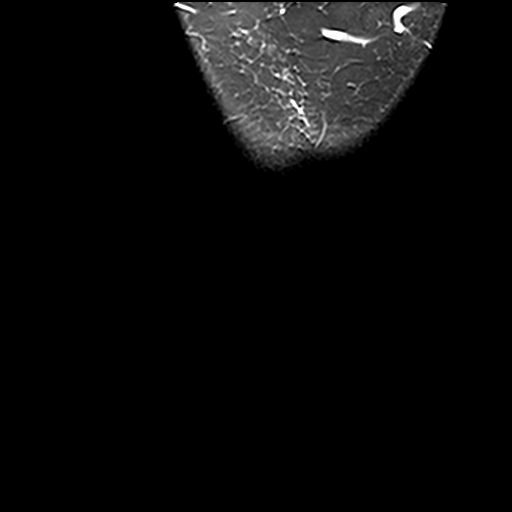

[40 of 40 positions shown; findings below may reference images not displayed]

FINDINGS: TENDONS

Peroneal: Peroneal longus tendinosis and small partial tear at the
level of the peroneal tubercle. Small split tear of the peroneal
brevis tendon at and just below the tip of the lateral malleolus.
Mild peroneal tenosynovitis.

Posteromedial: Posterior tibial tendon intact. Flexor hallucis
longus tendon intact. Flexor digitorum longus tendon intact.

Anterior: Tibialis anterior tendon intact. Extensor hallucis longus
tendon intact Extensor digitorum longus tendon intact.

Achilles: Tiny longitudinal intrasubstance tear in the lateral
aspect of the mid to distal Achilles tendon.

Plantar Fascia: Intact.

LIGAMENTS

Lateral: Anterior talofibular ligament intact. Calcaneofibular
ligament intact. Posterior talofibular ligament intact. Anterior and
posterior tibiofibular ligaments intact with increased intermediate
signal in the posterior tibiofibular ligament.

Medial: Deltoid ligament intact with increased intermediate signal.
Spring ligament intact.

CARTILAGE

Ankle Joint: Small joint effusion with synovitis. Normal ankle
mortise. Diffuse tibiotalar cartilage thinning without focal defect.

Subtalar Joints/Sinus Tarsi: Small posterior subtalar joint
effusion. Normal sinus tarsi.

Bones: Marrow edema in the medial malleolus, lateral talus, lateral
greater than medial calcaneal body, cuboid, navicular, middle and
lateral cuneiforms, and bases of the second through fifth
metatarsals. Suspected small erosions involving the medial
malleolus, second through fifth TMT joints, and navicular-middle
cuneiform joint. Talonavicular and calcaneocuboid joint space
narrowing. No fracture or dislocation.

Soft Tissue: No soft tissue mass or fluid collection.
IMPRESSION: 1. Findings most suggestive of rheumatoid arthritis involving the
mid- and hindfoot.
2. Peroneal longus tendinosis and small partial tear.
3. Small split tear of the peroneal brevis tendon.
4. Tiny longitudinal intrasubstance tear of the mid to distal
Achilles tendon.

## 2020-03-07 ENCOUNTER — Other Ambulatory Visit: Payer: Self-pay | Admitting: Obstetrics and Gynecology

## 2020-03-07 DIAGNOSIS — Z1231 Encounter for screening mammogram for malignant neoplasm of breast: Secondary | ICD-10-CM

## 2020-06-01 ENCOUNTER — Ambulatory Visit
Admission: RE | Admit: 2020-06-01 | Discharge: 2020-06-01 | Disposition: A | Discharge status: Home / Self Care | Payer: 59 | Source: Ambulatory Visit | Attending: Obstetrics and Gynecology | Admitting: Obstetrics and Gynecology

## 2020-06-01 ENCOUNTER — Other Ambulatory Visit: Payer: Self-pay

## 2020-06-01 DIAGNOSIS — Z1231 Encounter for screening mammogram for malignant neoplasm of breast: Secondary | ICD-10-CM | POA: Insufficient documentation

## 2020-06-03 ENCOUNTER — Encounter: Payer: Self-pay | Admitting: Obstetrics and Gynecology

## 2021-02-22 ENCOUNTER — Encounter: Payer: Self-pay | Admitting: Obstetrics and Gynecology

## 2021-02-22 ENCOUNTER — Other Ambulatory Visit: Payer: Self-pay

## 2021-02-22 ENCOUNTER — Other Ambulatory Visit (HOSPITAL_COMMUNITY)
Admission: RE | Admit: 2021-02-22 | Discharge: 2021-02-22 | Disposition: A | Discharge status: Home / Self Care | Payer: 59 | Source: Ambulatory Visit | Attending: Obstetrics and Gynecology | Admitting: Obstetrics and Gynecology

## 2021-02-22 ENCOUNTER — Ambulatory Visit (INDEPENDENT_AMBULATORY_CARE_PROVIDER_SITE_OTHER): Payer: 59 | Admitting: Obstetrics and Gynecology

## 2021-02-22 VITALS — BP 130/90 | Ht 60.0 in | Wt 178.0 lb

## 2021-02-22 DIAGNOSIS — N939 Abnormal uterine and vaginal bleeding, unspecified: Secondary | ICD-10-CM | POA: Diagnosis not present

## 2021-02-22 DIAGNOSIS — Z124 Encounter for screening for malignant neoplasm of cervix: Secondary | ICD-10-CM | POA: Insufficient documentation

## 2021-02-22 DIAGNOSIS — Z1151 Encounter for screening for human papillomavirus (HPV): Secondary | ICD-10-CM | POA: Insufficient documentation

## 2021-02-22 NOTE — Progress Notes (Signed)
Georgette Helmer, Ilona Sorrel, PA-C   Chief Complaint  Patient presents with   Menstrual Problem    Last cycle lasted 12 days, normal flow, no abnormal pain    HPI:      Ms. Lineth Thielke is a 50 y.o. G1P0002 whose LMP was Patient's last menstrual period was 02/06/2021 (exact date)., presents today for AUB this past month. Menses are usually Q1-2 months, lasting 7 days, mod flow with nickel sized clots, no BTB, no dysmen. LMP started on time and lasted only a few days of real flow, then light bleeding to dark brown d/c back and forth till 02/17/21. No dysmen, AUB unusual for pt. No bleeding since. No hx of leio. Hx of irreg bleeding 11/19 but thought could have been due to SAB. No vasomotor sx.  She is sex active, no pain/bleeding.  Neg pap/neg HPV DNA 06/05/17  Past Medical History:  Diagnosis Date   GERD (gastroesophageal reflux disease)    Hypertension    Obesity    RA (rheumatoid arthritis) (HCC)     Past Surgical History:  Procedure Laterality Date   CESAREAN SECTION  2000   twins   COLONOSCOPY WITH PROPOFOL N/A 06/24/2015   Procedure: COLONOSCOPY WITH PROPOFOL;  Surgeon: Elnita Maxwell, MD;  Location: [facility] ENDOSCOPY;  Service: Endoscopy;  Laterality: N/A;   ESOPHAGOGASTRODUODENOSCOPY (EGD) WITH PROPOFOL N/A 06/24/2015   Procedure: ESOPHAGOGASTRODUODENOSCOPY (EGD) WITH PROPOFOL;  Surgeon: Elnita Maxwell, MD;  Location: [facility] ENDOSCOPY;  Service: Endoscopy;  Laterality: N/A;   right ureter     TONSILLECTOMY      Family History  Problem Relation Age of Onset   Rheum arthritis Mother    Lung cancer Paternal Grandfather    Lung cancer Father 11   Kidney cancer Maternal Grandmother 79   Breast cancer Neg Hx     Social History   Socioeconomic History   Marital status: Married    Spouse name: Not on file   Number of children: Not on file   Years of education: Not on file   Highest education level: Not on file  Occupational History   Not on file  Tobacco Use    Smoking status: Never   Smokeless tobacco: Never  Vaping Use   Vaping Use: Never used  Substance and Sexual Activity   Alcohol use: No   Drug use: No   Sexual activity: Yes    Birth control/protection: Condom  Other Topics Concern   Not on file  Social History Narrative   Not on file   Social Determinants of Health   Financial Resource Strain: Not on file  Food Insecurity: Not on file  Transportation Needs: Not on file  Physical Activity: Not on file  Stress: Not on file  Social Connections: Not on file  Intimate Partner Violence: Not on file    Outpatient Medications Prior to Visit  Medication Sig Dispense Refill   cholecalciferol (VITAMIN D) 1000 UNITS tablet Take 1,000 Units by mouth daily.     hydroxychloroquine (PLAQUENIL) 200 MG tablet Take 200 mg by mouth 2 (two) times daily.     tretinoin (RETIN-A) 0.05 % cream SMARTSIG:Sparingly Topical Every Morning     IRON-FOLIC ACID PO Take by mouth.     Multiple Vitamin (MULTI-VITAMIN) tablet Take by mouth.     Probiotic Product (PROBIOTIC-10 PO) Take by mouth.     Zinc 50 MG TABS Take by mouth.     No facility-administered medications prior to visit.  ROS:  Review of Systems  Constitutional:  Negative for fever.  Gastrointestinal:  Negative for blood in stool, constipation, diarrhea, nausea and vomiting.  Genitourinary:  Positive for menstrual problem. Negative for dyspareunia, dysuria, flank pain, frequency, hematuria, urgency, vaginal bleeding, vaginal discharge and vaginal pain.  Musculoskeletal:  Negative for back pain.  Skin:  Negative for rash.  BREAST: No symptoms   OBJECTIVE:   Vitals:  BP 130/90   Ht 5' (1.524 m)   Wt 178 lb (80.7 kg)   LMP 02/06/2021 (Exact Date)   BMI 34.76 kg/m   Physical Exam Vitals reviewed.  Constitutional:      Appearance: She is well-developed.  Pulmonary:     Effort: Pulmonary effort is normal.  Genitourinary:    General: Normal vulva.     Pubic Area: No  rash.      Labia:        Right: No rash, tenderness or lesion.        Left: No rash, tenderness or lesion.      Vagina: Normal. No vaginal discharge, erythema or tenderness.     Cervix: Normal.     Uterus: Normal. Not enlarged and not tender.      Adnexa: Right adnexa normal and left adnexa normal.       Right: No mass or tenderness.         Left: No mass or tenderness.    Musculoskeletal:        General: Normal range of motion.     Cervical back: Normal range of motion.  Skin:    General: Skin is warm and dry.  Neurological:     General: No focal deficit present.     Mental Status: She is alert and oriented to person, place, and time.  Psychiatric:        Mood and Affect: Mood normal.        Behavior: Behavior normal.        Thought Content: Thought content normal.        Judgment: Judgment normal.    Assessment/Plan: Abnormal uterine bleeding (AUB)--this past cycle. Bleeding stopped. Check pap. Follow future cycles. If sx recur/persist, will check labs and GYN u/s. If resolves, then probably hormonal. F/u at upcoming annual.   Cervical cancer screening - Plan: Cytology - PAP  Screening for HPV (human papillomavirus) - Plan: Cytology - PAP   Return if symptoms worsen or fail to improve.  Tuwanna Krausz B. Sol Odor, PA-C 02/22/2021 3:09 PM

## 2021-03-02 LAB — CYTOLOGY - PAP
Comment: NEGATIVE
Diagnosis: NEGATIVE
Diagnosis: REACTIVE
High risk HPV: NEGATIVE

## 2021-04-05 ENCOUNTER — Ambulatory Visit: Payer: 59 | Admitting: Obstetrics and Gynecology

## 2021-04-12 ENCOUNTER — Ambulatory Visit (INDEPENDENT_AMBULATORY_CARE_PROVIDER_SITE_OTHER): Payer: 59 | Admitting: Obstetrics and Gynecology

## 2021-04-12 ENCOUNTER — Other Ambulatory Visit: Payer: Self-pay

## 2021-04-12 ENCOUNTER — Encounter: Payer: Self-pay | Admitting: Obstetrics and Gynecology

## 2021-04-12 VITALS — BP 140/80 | Ht 60.0 in | Wt 177.0 lb

## 2021-04-12 DIAGNOSIS — Z01419 Encounter for gynecological examination (general) (routine) without abnormal findings: Secondary | ICD-10-CM

## 2021-04-12 DIAGNOSIS — Z1231 Encounter for screening mammogram for malignant neoplasm of breast: Secondary | ICD-10-CM

## 2021-04-12 DIAGNOSIS — N939 Abnormal uterine and vaginal bleeding, unspecified: Secondary | ICD-10-CM | POA: Diagnosis not present

## 2021-04-12 DIAGNOSIS — Z1211 Encounter for screening for malignant neoplasm of colon: Secondary | ICD-10-CM | POA: Diagnosis not present

## 2021-04-12 NOTE — Progress Notes (Signed)
PCP:  Chad Cordial, PA-C   Chief Complaint  Patient presents with   Gynecologic Exam    Long cycles     HPI:      [name] is a 50 y.o. G1P0 who LMP was Patient's last menstrual period was 03/17/2021 (approximate)., presents today for her annual examination.  Her menses were Q1-2 months, lasting 7 days, spotting at the end prior to 10/22.  Bleeding lasted about 12 days 10/22 and 20 days this cycle. Flow is light to mod, with small clots, and then with multiple days of spotting only. Had neg pap 11/22. Dysmenorrhea not usually. Occas vasomotor sx. Has lost 61 # in past 1 1/2 yrs but none recently.   Sex activity: single partner, contraception - condoms , declines other BC. No pain/bleeding. Did OCPs and BC patch in past without side effects. No hx of HTN, DVTs, migraines with aura Last Pap: 02/22/21 Results were: no abnormalities /neg HPV DNA  Hx of STDs: hx of abn pap  Last mammogram: 06/01/20  Results were: normal--routine follow-up in 12 months There is no FH of breast cancer. There is no FH of ovarian cancer. The patient does do self-breast exams.   Tobacco use: The patient denies current or previous tobacco use. Alcohol use: social No drug use.  Exercise: mod active  Colonoscopy with Dr. Rayann Heman at [facility]; due this yr per pt. Getting Q5 yrs due to hx of rectal bleeding in past and FH colon polyps in her dad.   She does get adequate calcium and Vitamin D in her diet.  Labs with PCP   Past Medical History:  Diagnosis Date   GERD (gastroesophageal reflux disease)    Hypertension    Obesity    RA (rheumatoid arthritis) ([place])     Past Surgical History:  Procedure Laterality Date   CESAREAN SECTION  2000   twins   COLONOSCOPY WITH PROPOFOL N/A 06/24/2015   Procedure: COLONOSCOPY WITH PROPOFOL;  Surgeon: Josefine Class, MD;  Location: [facility] ENDOSCOPY;  Service: Endoscopy;  Laterality: N/A;   ESOPHAGOGASTRODUODENOSCOPY (EGD) WITH PROPOFOL N/A  06/24/2015   Procedure: ESOPHAGOGASTRODUODENOSCOPY (EGD) WITH PROPOFOL;  Surgeon: Josefine Class, MD;  Location: [facility] ENDOSCOPY;  Service: Endoscopy;  Laterality: N/A;   right ureter     TONSILLECTOMY      Family History  Problem Relation Age of Onset   Rheum arthritis Mother    Lung cancer Paternal Grandfather    Lung cancer Father 1   Kidney cancer Maternal Grandmother 31   Breast cancer Neg Hx     Social History   Socioeconomic History   Marital status: Married    Spouse name: Not on file   Number of children: Not on file   Years of education: Not on file   Highest education level: Not on file  Occupational History   Not on file  Tobacco Use   Smoking status: Never   Smokeless tobacco: Never  Vaping Use   Vaping Use: Never used  Substance and Sexual Activity   Alcohol use: No   Drug use: No   Sexual activity: Yes    Birth control/protection: Condom  Other Topics Concern   Not on file  Social History Narrative   Not on file   Social Determinants of Health   Financial Resource Strain: Not on file  Food Insecurity: Not on file  Transportation Needs: Not on file  Physical Activity: Not on file  Stress: Not on file  Social Connections: Not on file  Intimate Partner Violence: Not on file    Current Meds  Medication Sig   cholecalciferol (VITAMIN D) 1000 UNITS tablet Take 1,000 Units by mouth daily.   hydroxychloroquine (PLAQUENIL) 200 MG tablet Take 200 mg by mouth 2 (two) times daily.   tretinoin (RETIN-A) 0.05 % cream SMARTSIG:Sparingly Topical Every Morning     ROS:  Review of Systems  Constitutional:  Negative for fatigue, fever and unexpected weight change.  Respiratory:  Negative for cough, shortness of breath and wheezing.   Cardiovascular:  Negative for chest pain, palpitations and leg swelling.  Gastrointestinal:  Negative for blood in stool, constipation, diarrhea, nausea and vomiting.  Endocrine: Negative for cold intolerance, heat  intolerance and polyuria.  Genitourinary:  Positive for menstrual problem. Negative for dyspareunia, dysuria, flank pain, frequency, genital sores, hematuria, pelvic pain, urgency, vaginal bleeding, vaginal discharge and vaginal pain.  Musculoskeletal:  Negative for back pain, joint swelling and myalgias.  Skin:  Negative for rash.  Neurological:  Negative for dizziness, syncope, light-headedness, numbness and headaches.  Hematological:  Negative for adenopathy.  Psychiatric/Behavioral:  Negative for agitation, confusion, sleep disturbance and suicidal ideas. The patient is not nervous/anxious.     Objective: BP 140/80    Ht 5' (1.524 m)    Wt 177 lb (80.3 kg)    LMP 03/17/2021 (Approximate)    BMI 34.57 kg/m   Physical Exam  Physical Exam Constitutional:      Appearance: She is well-developed.  Genitourinary:     Vulva normal.     Right Labia: No rash, tenderness or lesions.    Left Labia: No tenderness, lesions or rash.    No vaginal discharge, erythema or tenderness.      Right Adnexa: not tender and no mass present.    Left Adnexa: not tender and no mass present.    No cervical friability or polyp.     Uterus is not enlarged or tender.  Breasts:    Right: No mass, nipple discharge, skin change or tenderness.     Left: No mass, nipple discharge, skin change or tenderness.  Neck:     Thyroid: No thyromegaly.  Cardiovascular:     Rate and Rhythm: Normal rate and regular rhythm.     Heart sounds: Normal heart sounds. No murmur heard. Pulmonary:     Effort: Pulmonary effort is normal.     Breath sounds: Normal breath sounds.  Abdominal:     Palpations: Abdomen is soft.     Tenderness: There is no abdominal tenderness. There is no guarding or rebound.  Musculoskeletal:        General: Normal range of motion.     Cervical back: Normal range of motion.  Lymphadenopathy:     Cervical: No cervical adenopathy.  Neurological:     General: No focal deficit present.     Mental  Status: She is alert and oriented to person, place, and time.     Cranial Nerves: No cranial nerve deficit.  Skin:    General: Skin is warm and dry.  Psychiatric:        Mood and Affect: Mood normal.        Behavior: Behavior normal.        Thought Content: Thought content normal.        Judgment: Judgment normal.  Vitals reviewed.    Assessment/Plan: Encounter for annual routine gynecological examination  Encounter for screening mammogram for malignant neoplasm of breast - Plan: MM 3D  SCREEN BREAST BILATERAL; pt to [facility]  Abnormal uterine bleeding (AUB) - Plan: US PELVIC COMPLETE WITH TRANSVAGINAL; neg pap, neg exam. Check GYN u/s. Will f/u with results and mgmt. If neg, most likely perimenopausal bleeding.  Screening for colon cancer--pt to sched colonoscopy. F/u for ref prn.   GYN counsel breast self exam, mammography screening, adequate intake of calcium and vitamin D, diet and exercise     F/U  Return in about 1 year (around 04/12/2022).  Kanasia Gayman B. Emmani Lesueur, PA-C 04/12/2021 11:10 AM

## 2021-04-12 NOTE — Patient Instructions (Signed)
I value your feedback and you entrusting us with your care. If you get a [facility] patient survey, I would appreciate you taking the time to let us know about your experience today. Thank you!  [facility]: [phone]      

## 2021-04-21 ENCOUNTER — Ambulatory Visit
Admission: RE | Admit: 2021-04-21 | Discharge: 2021-04-21 | Disposition: A | Discharge status: Home / Self Care | Payer: 59 | Source: Ambulatory Visit | Attending: Obstetrics and Gynecology | Admitting: Obstetrics and Gynecology

## 2021-04-21 DIAGNOSIS — N939 Abnormal uterine and vaginal bleeding, unspecified: Secondary | ICD-10-CM | POA: Diagnosis present

## 2021-04-25 ENCOUNTER — Telehealth: Payer: Self-pay | Admitting: Obstetrics and Gynecology

## 2021-04-25 ENCOUNTER — Telehealth: Payer: Self-pay

## 2021-04-25 ENCOUNTER — Encounter: Payer: Self-pay | Admitting: Obstetrics and Gynecology

## 2021-04-25 NOTE — Telephone Encounter (Signed)
Pt aware of GYN u/s results. Had 2 months AUB, EM=15 mm. No menses yet this month. Will follow cycles. If sx persist, will check EMB but could also be polyp and D&C would be therapeutic. Could also do prog trial given perimenopause. Pt will f/u with next menses.

## 2021-04-25 NOTE — Telephone Encounter (Signed)
Patient s returning missed call. Please advise

## 2021-04-25 NOTE — Telephone Encounter (Signed)
Pt returned your call.  She is available until 2:20.

## 2021-04-25 NOTE — Telephone Encounter (Signed)
Done on another msg

## 2021-05-25 ENCOUNTER — Encounter: Payer: Self-pay | Admitting: Obstetrics and Gynecology

## 2021-06-07 ENCOUNTER — Other Ambulatory Visit: Payer: Self-pay

## 2021-06-07 ENCOUNTER — Ambulatory Visit
Admission: RE | Admit: 2021-06-07 | Discharge: 2021-06-07 | Disposition: A | Discharge status: Home / Self Care | Payer: 59 | Source: Ambulatory Visit | Attending: Obstetrics and Gynecology | Admitting: Obstetrics and Gynecology

## 2021-06-07 DIAGNOSIS — Z1231 Encounter for screening mammogram for malignant neoplasm of breast: Secondary | ICD-10-CM | POA: Insufficient documentation

## 2021-07-04 ENCOUNTER — Other Ambulatory Visit: Payer: Self-pay

## 2021-07-04 ENCOUNTER — Encounter: Payer: Self-pay | Admitting: Obstetrics and Gynecology

## 2021-07-04 ENCOUNTER — Other Ambulatory Visit (HOSPITAL_COMMUNITY)
Admission: RE | Admit: 2021-07-04 | Discharge: 2021-07-04 | Disposition: A | Discharge status: Home / Self Care | Payer: 59 | Source: Ambulatory Visit | Attending: Obstetrics and Gynecology | Admitting: Obstetrics and Gynecology

## 2021-07-04 ENCOUNTER — Ambulatory Visit (INDEPENDENT_AMBULATORY_CARE_PROVIDER_SITE_OTHER): Payer: 59 | Admitting: Obstetrics and Gynecology

## 2021-07-04 VITALS — BP 130/90 | Ht 60.0 in | Wt 179.0 lb

## 2021-07-04 DIAGNOSIS — N939 Abnormal uterine and vaginal bleeding, unspecified: Secondary | ICD-10-CM | POA: Insufficient documentation

## 2021-07-04 NOTE — Progress Notes (Signed)
? ? ?Rishabh Rinkenberger, Deirdre Evener, PA-C ? ? ?Chief Complaint  ?Patient presents with  ? endometrial biopsy  ? ? ?HPI: ?     [name] is a 51 y.o. G1P0002 whose LMP was Patient's last menstrual period was 06/17/2021 (exact date)., presents today for EMB for AUB. Her menses were Q1-2 months, lasting 7 days, spotting at the end prior to 10/22.  Bleeding lasted about 12 days 10/22 and 20 days 11/22. Bled for about 20 days 1/23 and then started period again 06/17/21 and still having spotting now. Flow is light to mod, with small clots, and then with multiple days of spotting only. Had neg pap 11/22; GYN u/s 12/22 showed EM=15 mm. Recommended EMB and pt here today since sx not improved.  ?Pt hesitant to try hormones but is considering prog only options. Did hormone testing with functional med provider 9/22 that showed low progesterone.  ? ?Sex activity: single partner, contraception - condoms , declines other BC. No pain/bleeding. Did OCPs and BC patch in past without side effects. No hx of HTN, DVTs, migraines with aura ? ?Past Medical History:  ?Diagnosis Date  ? GERD (gastroesophageal reflux disease)   ? Hypertension   ? Obesity   ? RA (rheumatoid arthritis) ([place])   ? ? ?Past Surgical History:  ?Procedure Laterality Date  ? CESAREAN SECTION  2000  ? twins  ? COLONOSCOPY WITH PROPOFOL N/A 06/24/2015  ? Procedure: COLONOSCOPY WITH PROPOFOL;  Surgeon: Josefine Class, MD;  Location: [facility] ENDOSCOPY;  Service: Endoscopy;  Laterality: N/A;  ? ESOPHAGOGASTRODUODENOSCOPY (EGD) WITH PROPOFOL N/A 06/24/2015  ? Procedure: ESOPHAGOGASTRODUODENOSCOPY (EGD) WITH PROPOFOL;  Surgeon: Josefine Class, MD;  Location: [facility] ENDOSCOPY;  Service: Endoscopy;  Laterality: N/A;  ? right ureter    ? TONSILLECTOMY    ? ? ?Family History  ?Problem Relation Age of Onset  ? Rheum arthritis Mother   ? Lung cancer Paternal Grandfather   ? Lung cancer Father 54  ? Kidney cancer Maternal Grandmother 52  ? Breast cancer Neg Hx   ? ? ?Social  History  ? ?Socioeconomic History  ? Marital status: Married  ?  Spouse name: Not on file  ? Number of children: Not on file  ? Years of education: Not on file  ? Highest education level: Not on file  ?Occupational History  ? Not on file  ?Tobacco Use  ? Smoking status: Never  ? Smokeless tobacco: Never  ?Vaping Use  ? Vaping Use: Never used  ?Substance and Sexual Activity  ? Alcohol use: No  ? Drug use: No  ? Sexual activity: Yes  ?  Birth control/protection: Condom  ?Other Topics Concern  ? Not on file  ?Social History Narrative  ? Not on file  ? ?Social Determinants of Health  ? ?Financial Resource Strain: Not on file  ?Food Insecurity: Not on file  ?Transportation Needs: Not on file  ?Physical Activity: Not on file  ?Stress: Not on file  ?Social Connections: Not on file  ?Intimate Partner Violence: Not on file  ? ? ?Outpatient Medications Prior to Visit  ?Medication Sig Dispense Refill  ? cholecalciferol (VITAMIN D) 1000 UNITS tablet Take 1,000 Units by mouth daily.    ? hydroxychloroquine (PLAQUENIL) 200 MG tablet Take 200 mg by mouth 2 (two) times daily.    ? tretinoin (RETIN-A) 0.05 % cream SMARTSIG:Sparingly Topical Every Morning    ? ?No facility-administered medications prior to visit.  ? ? ? ? ?ROS: ? ?Review of Systems  ?  Constitutional:  Negative for fever.  ?Gastrointestinal:  Negative for blood in stool, constipation, diarrhea, nausea and vomiting.  ?Genitourinary:  Positive for menstrual problem. Negative for dyspareunia, dysuria, flank pain, frequency, hematuria, urgency, vaginal bleeding, vaginal discharge and vaginal pain.  ?Musculoskeletal:  Negative for back pain.  ?Skin:  Negative for rash.  ?BREAST: No symptoms ? ? ?OBJECTIVE:  ? ?Vitals:  ?BP 130/90   Ht 5' (1.524 m)   Wt 179 lb (81.2 kg)   LMP 06/17/2021 (Exact Date)   BMI 34.96 kg/m?  ? ?Physical Exam ?Vitals reviewed.  ?Constitutional:   ?   Appearance: She is well-developed.  ?Pulmonary:  ?   Effort: Pulmonary effort is normal.   ?Genitourinary: ?   General: Normal vulva.  ?   Pubic Area: No rash.   ?   Labia:     ?   Right: No rash, tenderness or lesion.     ?   Left: No rash, tenderness or lesion.   ?   Vagina: Bleeding present. No vaginal discharge, erythema or tenderness.  ?   Cervix: Normal.  ?Musculoskeletal:     ?   General: Normal range of motion.  ?   Cervical back: Normal range of motion.  ?Skin: ?   General: Skin is warm and dry.  ?Neurological:  ?   General: No focal deficit present.  ?   Mental Status: She is alert and oriented to person, place, and time.  ?Psychiatric:     ?   Mood and Affect: Mood normal.     ?   Behavior: Behavior normal.     ?   Thought Content: Thought content normal.     ?   Judgment: Judgment normal.  ? ?Endometrial Biopsy ?After discussion with the patient regarding her abnormal uterine bleeding I recommended that she proceed with an endometrial biopsy for further diagnosis. The risks, benefits, alternatives, and indications for an endometrial biopsy were discussed with the patient in detail.  Verbal consent was obtained.  ? ?PROCEDURE NOTE:  [place] endometrial biopsy was performed using aseptic technique with iodine preparation.  ?The uterus was sounded to a length of 8.0 cm.  Adequate sampling was obtained with minimal blood loss.  The patient tolerated the procedure well.  Disposition will be pending pathology. ? ? ?Assessment/Plan: ?Abnormal uterine bleeding (AUB) - Plan: Surgical pathology; EMB today. Discussed hormonal options (pt considering prog only options but hesitant about risks of hormones), IUD, ablation, hyst. Will f/u with results and mgmt options.  ? ? ? ? Return if symptoms worsen or fail to improve. ? ?Kitrina Maurin B. Curtis Uriarte, PA-C ?07/04/2021 ?5:01 PM ? ? ? ? ? ?

## 2021-07-04 NOTE — Patient Instructions (Signed)
I value your feedback and you entrusting us with your care. If you get a [facility] patient survey, I would appreciate you taking the time to let us know about your experience today. Thank you! ? ? ?

## 2021-07-06 LAB — SURGICAL PATHOLOGY

## 2021-07-10 ENCOUNTER — Encounter: Payer: Self-pay | Admitting: Obstetrics and Gynecology

## 2021-07-12 NOTE — Telephone Encounter (Signed)
This encounter was created in error - please disregard.

## 2022-02-05 ENCOUNTER — Encounter (INDEPENDENT_AMBULATORY_CARE_PROVIDER_SITE_OTHER): Payer: 59 | Admitting: Vascular Surgery

## 2022-02-16 ENCOUNTER — Telehealth (INDEPENDENT_AMBULATORY_CARE_PROVIDER_SITE_OTHER): Payer: Self-pay

## 2022-02-16 NOTE — Telephone Encounter (Signed)
LVM for pt in response to moving her appt from 11.8.23 to 11.22.23 (office moving). Pt LVMOM that 11.22.23 would work for her but preferably in the morning. Unfortunately, we do not have any morning appts that day. I made pt an appt for 11.22.23 at 1:30 pm and asked her to call me back to let me know if that would work. If not, we can R/S. Will await a call back from pt.

## 2022-02-19 ENCOUNTER — Encounter (INDEPENDENT_AMBULATORY_CARE_PROVIDER_SITE_OTHER): Payer: Self-pay

## 2022-02-28 ENCOUNTER — Encounter (INDEPENDENT_AMBULATORY_CARE_PROVIDER_SITE_OTHER): Payer: 59 | Admitting: Nurse Practitioner

## 2022-03-13 ENCOUNTER — Other Ambulatory Visit: Payer: Self-pay | Admitting: Obstetrics and Gynecology

## 2022-03-13 NOTE — Progress Notes (Signed)
Received notification from Ethelene Browns, DNP at [facility] that pt is now on prog 100 mg QHS.

## 2022-03-14 ENCOUNTER — Encounter (INDEPENDENT_AMBULATORY_CARE_PROVIDER_SITE_OTHER): Payer: Self-pay | Admitting: Nurse Practitioner

## 2022-03-14 ENCOUNTER — Ambulatory Visit (INDEPENDENT_AMBULATORY_CARE_PROVIDER_SITE_OTHER): Payer: 59 | Admitting: Nurse Practitioner

## 2022-03-14 VITALS — BP 146/82 | HR 69 | Resp 16 | Ht 60.0 in | Wt 191.0 lb

## 2022-03-14 DIAGNOSIS — I8311 Varicose veins of right lower extremity with inflammation: Secondary | ICD-10-CM

## 2022-03-14 DIAGNOSIS — M0609 Rheumatoid arthritis without rheumatoid factor, multiple sites: Secondary | ICD-10-CM

## 2022-03-14 DIAGNOSIS — N281 Cyst of kidney, acquired: Secondary | ICD-10-CM | POA: Insufficient documentation

## 2022-03-14 DIAGNOSIS — I8312 Varicose veins of left lower extremity with inflammation: Secondary | ICD-10-CM

## 2022-03-16 ENCOUNTER — Encounter (INDEPENDENT_AMBULATORY_CARE_PROVIDER_SITE_OTHER): Payer: Self-pay | Admitting: Nurse Practitioner

## 2022-03-16 NOTE — Progress Notes (Signed)
Subjective:    Patient ID: Victoria Berry, female    DOB: [birth year], 51 y.o.   MRN: [id number] Chief Complaint  Patient presents with   New Patient (Initial Visit)    Consult for VV    Victoria Berry is a 51 year old female who presents today for evaluation of lower extremity edema and varicose veins.  The patient had previously had an evaluation by [facility] prior to COVID 19 onset.  She notes that at the time she was not ready to move forward with any intervention however since that time she has been wearing medical grade compression stockings daily.  She also diligently elevates her lower extremities.  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  She also notes some swelling of the lower extremities as well.  She has notable past medical history of rheumatoid arthritis which is currently well controlled with medication.  She does note that she has swelling in her lower extremities as the day goes on.  There have been no open wounds or ulcerations.      Review of Systems: Negative Unless Checked Constitutional: [] Weight loss  [] Fever  [] Chills Cardiac: [] Chest pain   []  Atrial Fibrillation  [] Palpitations   [] Shortness of breath when laying flat   [] Shortness of breath with exertion. [] Shortness of breath at rest Vascular:  [] Pain in legs with walking   [] Pain in legs with standing [] Pain in legs when laying flat   [] Claudication    [] Pain in feet when laying flat    [] History of DVT   [] Phlebitis   [x] Swelling in legs   [] Varicose veins   [] Non-healing ulcers Pulmonary:   [] Uses home oxygen   [] Productive cough   [] Hemoptysis   [] Wheeze  [] COPD   [] Asthma Neurologic:  [] Dizziness   [] Seizures  [] Blackouts [] History of stroke   [] History of TIA  [] Aphasia   [] Temporary Blindness   [] Weakness or numbness in arm   [] Weakness or numbness in leg Musculoskeletal:   [] Joint swelling   [] Joint pain   [] Low back pain  []  History of Knee  Replacement [] Arthritis [] back Surgeries  []  Spinal Stenosis    Hematologic:  [] Easy bruising  [] Easy bleeding   [] Hypercoagulable state   [] Anemic Gastrointestinal:  [] Diarrhea   [] Vomiting  [] Gastroesophageal reflux/heartburn   [] Difficulty swallowing. [] Abdominal pain Genitourinary:  [] Chronic kidney disease   [] Difficult urination  [] Anuric   [] Blood in urine [] Frequent urination  [] Burning with urination   [] Hematuria Skin:  [] Rashes   [] Ulcers [] Wounds Psychological:  [] History of anxiety   []  History of major depression  []  Memory Difficulties     Objective:   Physical Exam Vitals reviewed.  HENT:     Head: Normocephalic.  Cardiovascular:     Rate and Rhythm: Normal rate.  Pulmonary:     Effort: Pulmonary effort is normal.  Musculoskeletal:     Right lower leg: Edema present.     Left lower leg: Edema present.  Skin:    General: Skin is warm and dry.  Neurological:     Mental Status: She is alert and oriented to person, place, and time.  Psychiatric:        Mood and Affect: Mood normal.        Behavior: Behavior normal.        Thought Content: Thought content normal.        Judgment: Judgment normal.     BP (!) 146/82 (BP Location: Left Arm)  Pulse 69   Resp 16   Ht 5' (1.524 m)   Wt 191 lb (86.6 kg)   BMI 37.30 kg/m   Past Medical History:  Diagnosis Date   GERD (gastroesophageal reflux disease)    Hypertension    Obesity    RA (rheumatoid arthritis) (HCC)     Social History   Socioeconomic History   Marital status: Married    Spouse name: Not on file   Number of children: Not on file   Years of education: Not on file   Highest education level: Not on file  Occupational History   Not on file  Tobacco Use   Smoking status: Never   Smokeless tobacco: Never  Vaping Use   Vaping Use: Never used  Substance and Sexual Activity   Alcohol use: No   Drug use: No   Sexual activity: Yes    Birth control/protection: Condom  Other Topics Concern   Not  on file  Social History Narrative   Not on file   Social Determinants of Health   Financial Resource Strain: Not on file  Food Insecurity: Not on file  Transportation Needs: Not on file  Physical Activity: Not on file  Stress: Not on file  Social Connections: Not on file  Intimate Partner Violence: Not on file    Past Surgical History:  Procedure Laterality Date   CESAREAN SECTION  2000   twins   COLONOSCOPY WITH PROPOFOL N/A 06/24/2015   Procedure: COLONOSCOPY WITH PROPOFOL;  Surgeon: Elnita Maxwell, MD;  Location: [facility] ENDOSCOPY;  Service: Endoscopy;  Laterality: N/A;   ESOPHAGOGASTRODUODENOSCOPY (EGD) WITH PROPOFOL N/A 06/24/2015   Procedure: ESOPHAGOGASTRODUODENOSCOPY (EGD) WITH PROPOFOL;  Surgeon: Elnita Maxwell, MD;  Location: [facility] ENDOSCOPY;  Service: Endoscopy;  Laterality: N/A;   right ureter     TONSILLECTOMY      Family History  Problem Relation Age of Onset   Rheum arthritis Mother    Lung cancer Paternal Grandfather    Lung cancer Father 80   Kidney cancer Maternal Grandmother 53   Breast cancer Neg Hx     Allergies  Allergen Reactions   Penicillins Nausea And Vomiting and Other (See Comments)    Had it as a child Had it as a child Had it as a child Had it as a child        No data to display            CMP  No results found for: "NA", "K", "CL", "CO2", "GLUCOSE", "BUN", "CREATININE", "CALCIUM", "PROT", "ALBUMIN", "AST", "ALT", "ALKPHOS", "BILITOT", "GFRNONAA", "GFRAA"   No results found.     Assessment & Plan:   1. Varicose veins of both lower extremities with inflammation  Recommend:  The patient has large symptomatic varicose veins that are painful and associated with swelling.  I have had a long discussion with the patient regarding  varicose veins and why they cause symptoms.  Patient will continue wearing graduated compression stockings class 1 on a daily basis, beginning first thing in the morning and removing them in the  evening. The patient is instructed specifically not to sleep in the stockings.     In addition, behavioral modification including elevation during the day will be continued  Pending the results of these changes the  patient will be reevaluated in three months.   An ultrasound of the venous system will be obtained.   Further plans will be based on the ultrasound results and whether conservative therapies are successful  at eliminating the pain and swelling.   2. Rheumatoid arthritis of multiple sites with negative rheumatoid factor (HCC) Patient will continue to follow with rheumatology   Current Outpatient Medications on File Prior to Visit  Medication Sig Dispense Refill   ALPHA LIPOIC ACID PO Take by mouth.     Calcium Saccharate (CALCIUM D-GLUCARATE PO) Take by mouth.     cholecalciferol (VITAMIN D) 1000 UNITS tablet Take 1,000 Units by mouth daily.     hydroxychloroquine (PLAQUENIL) 200 MG tablet Take 200 mg by mouth 2 (two) times daily.     magnesium citrate SOLN Take 1 Bottle by mouth once.     Multiple Vitamins-Minerals (PRESERVISION AREDS 2 PO) Take by mouth.     progesterone (PROMETRIUM) 100 MG capsule Take 100 mg by mouth daily.     tretinoin (RETIN-A) 0.05 % cream SMARTSIG:Sparingly Topical Every Morning     Turmeric 400 MG CAPS Take by mouth.     No current facility-administered medications on file prior to visit.    There are no Patient Instructions on file for this visit. No follow-ups on file.   Georgiana Spinner, NP

## 2022-04-12 ENCOUNTER — Other Ambulatory Visit (INDEPENDENT_AMBULATORY_CARE_PROVIDER_SITE_OTHER): Payer: Self-pay | Admitting: Nurse Practitioner

## 2022-04-12 DIAGNOSIS — I8311 Varicose veins of right lower extremity with inflammation: Secondary | ICD-10-CM

## 2022-04-18 ENCOUNTER — Ambulatory Visit (INDEPENDENT_AMBULATORY_CARE_PROVIDER_SITE_OTHER): Payer: 59 | Admitting: Nurse Practitioner

## 2022-04-18 ENCOUNTER — Encounter (INDEPENDENT_AMBULATORY_CARE_PROVIDER_SITE_OTHER): Payer: Self-pay | Admitting: Nurse Practitioner

## 2022-04-18 ENCOUNTER — Ambulatory Visit (INDEPENDENT_AMBULATORY_CARE_PROVIDER_SITE_OTHER): Payer: 59

## 2022-04-18 VITALS — BP 122/65 | HR 67 | Resp 16 | Wt 189.4 lb

## 2022-04-18 DIAGNOSIS — I8311 Varicose veins of right lower extremity with inflammation: Secondary | ICD-10-CM | POA: Diagnosis not present

## 2022-04-18 DIAGNOSIS — I8312 Varicose veins of left lower extremity with inflammation: Secondary | ICD-10-CM

## 2022-04-18 DIAGNOSIS — M0609 Rheumatoid arthritis without rheumatoid factor, multiple sites: Secondary | ICD-10-CM

## 2022-04-18 DIAGNOSIS — R599 Enlarged lymph nodes, unspecified: Secondary | ICD-10-CM

## 2022-04-18 NOTE — Progress Notes (Signed)
Subjective:    Patient ID: Victoria Berry, female    DOB: 1971/03/26, 51 y.o.   MRN: [id number] Chief Complaint  Patient presents with   Follow-up    Ultrasound follow up    Victoria Berry is a 51 year old female who returns today for evaluation of her lower extremity edema and varicose veins.  The patient had previously had an evaluation by [facility] prior to COVID 19 onset.  She notes that at the time she was not ready to move forward with any intervention however since that time she has been wearing medical grade compression stockings daily.  She also diligently elevates her lower extremities.  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  She also notes some swelling of the lower extremities as well.  She has notable past medical history of rheumatoid arthritis which is currently well controlled with medication.  She does note that she has swelling in her lower extremities as the day goes on.  There have been no open wounds or ulcerations.  Today no evidence of DVT or superficial thrombophlebitis is noted bilaterally.  No evidence of deep venous insufficiency is noted bilaterally.  There is evidence of superficial venous reflux of the right lower extremity in the great saphenous vein there also enlarged bilaterally.    Review of Systems  Cardiovascular:  Positive for leg swelling.  All other systems reviewed and are negative.      Objective:   Physical Exam Vitals reviewed.  HENT:     Head: Normocephalic.  Cardiovascular:     Rate and Rhythm: Normal rate.     Pulses: Normal pulses.  Pulmonary:     Effort: Pulmonary effort is normal.  Musculoskeletal:     Right lower leg: Edema present.     Left lower leg: Edema present.  Skin:    General: Skin is warm and dry.  Neurological:     Mental Status: She is alert and oriented to person, place, and time.  Psychiatric:        Mood and Affect: Mood normal.         Behavior: Behavior normal.        Thought Content: Thought content normal.        Judgment: Judgment normal.     BP 122/65 (BP Location: Left Arm)   Pulse 67   Resp 16   Wt 189 lb 6.4 oz (85.9 kg)   BMI 36.99 kg/m   Past Medical History:  Diagnosis Date   GERD (gastroesophageal reflux disease)    Hypertension    Obesity    RA (rheumatoid arthritis) (HCC)     Social History   Socioeconomic History   Marital status: Married    Spouse name: Not on file   Number of children: Not on file   Years of education: Not on file   Highest education level: Not on file  Occupational History   Not on file  Tobacco Use   Smoking status: Never   Smokeless tobacco: Never  Vaping Use   Vaping Use: Never used  Substance and Sexual Activity   Alcohol use: No   Drug use: No   Sexual activity: Yes    Birth control/protection: Condom  Other Topics Concern   Not on file  Social History Narrative   Not on file   Social Determinants of Health   Financial Resource Strain: Not on file  Food Insecurity: Not on file  Transportation Needs: Not on file  Physical Activity: Not on file  Stress: Not on file  Social Connections: Not on file  Intimate Partner Violence: Not on file    Past Surgical History:  Procedure Laterality Date   CESAREAN SECTION  2000   twins   COLONOSCOPY WITH PROPOFOL N/A 06/24/2015   Procedure: COLONOSCOPY WITH PROPOFOL;  Surgeon: Elnita Maxwell, MD;  Location: [facility] ENDOSCOPY;  Service: Endoscopy;  Laterality: N/A;   ESOPHAGOGASTRODUODENOSCOPY (EGD) WITH PROPOFOL N/A 06/24/2015   Procedure: ESOPHAGOGASTRODUODENOSCOPY (EGD) WITH PROPOFOL;  Surgeon: Elnita Maxwell, MD;  Location: [facility] ENDOSCOPY;  Service: Endoscopy;  Laterality: N/A;   right ureter     TONSILLECTOMY      Family History  Problem Relation Age of Onset   Rheum arthritis Mother    Lung cancer Paternal Grandfather    Lung cancer Father 4   Kidney cancer Maternal Grandmother 18   Breast  cancer Neg Hx     Allergies  Allergen Reactions   Penicillins Nausea And Vomiting and Other (See Comments)    Had it as a child Had it as a child Had it as a child Had it as a child        No data to display            CMP  No results found for: "NA", "K", "CL", "CO2", "GLUCOSE", "BUN", "CREATININE", "CALCIUM", "PROT", "ALBUMIN", "AST", "ALT", "ALKPHOS", "BILITOT", "GFRNONAA", "GFRAA"   No results found.     Assessment & Plan:   1. Varicose veins of both lower extremities with inflammation We had a long discussion regarding varicose veins as well as possible treatment options.  At this time the patient will continue with conservative therapy options including use of medical grade compression socks, elevation and activity.  Will have the patient return in 3 months following institution of these conservative therapy tactics to discuss whether further intervention may be helpful and appropriate.  2. Rheumatoid arthritis of multiple sites with negative rheumatoid factor (HCC) May also account for patient's swollen lymph nodes  3. Swollen lymph nodes The patient does have some evidence of swollen lymph nodes on her ultrasound today.  We discussed possible causes such as localized infection such as yeast infection or UTI.  Potentially the patient has rheumatoid arthritis this may also be a cause as well.  Patient will follow-up with PCP for further evaluation for possible infection other workup.   Current Outpatient Medications on File Prior to Visit  Medication Sig Dispense Refill   ALPHA LIPOIC ACID PO Take by mouth.     Calcium Saccharate (CALCIUM D-GLUCARATE PO) Take by mouth.     cholecalciferol (VITAMIN D) 1000 UNITS tablet Take 1,000 Units by mouth daily.     hydroxychloroquine (PLAQUENIL) 200 MG tablet Take 200 mg by mouth 2 (two) times daily.     magnesium citrate SOLN Take 1 Bottle by mouth once.     Multiple Vitamins-Minerals (PRESERVISION AREDS 2 PO) Take by  mouth.     progesterone (PROMETRIUM) 100 MG capsule Take 100 mg by mouth daily.     tretinoin (RETIN-A) 0.05 % cream SMARTSIG:Sparingly Topical Every Morning     Turmeric 400 MG CAPS Take by mouth.     No current facility-administered medications on file prior to visit.    There are no Patient Instructions on file for this visit. No follow-ups on file.   Georgiana Spinner, NP

## 2022-06-04 ENCOUNTER — Ambulatory Visit (INDEPENDENT_AMBULATORY_CARE_PROVIDER_SITE_OTHER): Payer: 59 | Admitting: Nurse Practitioner

## 2022-06-04 ENCOUNTER — Encounter (INDEPENDENT_AMBULATORY_CARE_PROVIDER_SITE_OTHER): Payer: Self-pay | Admitting: Nurse Practitioner

## 2022-06-04 ENCOUNTER — Ambulatory Visit (INDEPENDENT_AMBULATORY_CARE_PROVIDER_SITE_OTHER): Payer: 59

## 2022-06-04 ENCOUNTER — Other Ambulatory Visit (INDEPENDENT_AMBULATORY_CARE_PROVIDER_SITE_OTHER): Payer: Self-pay | Admitting: Nurse Practitioner

## 2022-06-04 VITALS — BP 125/79 | HR 71 | Resp 16 | Ht 60.0 in | Wt 186.0 lb

## 2022-06-04 DIAGNOSIS — I8312 Varicose veins of left lower extremity with inflammation: Secondary | ICD-10-CM

## 2022-06-04 DIAGNOSIS — I8311 Varicose veins of right lower extremity with inflammation: Secondary | ICD-10-CM

## 2022-06-04 DIAGNOSIS — R599 Enlarged lymph nodes, unspecified: Secondary | ICD-10-CM | POA: Diagnosis not present

## 2022-06-05 ENCOUNTER — Encounter (INDEPENDENT_AMBULATORY_CARE_PROVIDER_SITE_OTHER): Payer: Self-pay | Admitting: Nurse Practitioner

## 2022-06-05 NOTE — Progress Notes (Signed)
Subjective:    Patient ID: Victoria Berry, female    DOB: Jan 19, 1971, 52 y.o.   MRN: [id number] Chief Complaint  Patient presents with   Follow-up    Follow up Pt wanting to see if she needs a biopsy ref Zani, Cashin Luellen-Conner is a 52 year old female who returns today to discuss a possible biopsy.  At her most recent visit when she had her reflux study done it was noted that she had enlarged lymph nodes in her bilateral groin sites.  She did had some illnesses over the winter and she also has underlying rheumatoid arthritis.  She visited her primary care physician who noted that they still felt enlarged and suggested possible biopsy.  This is understandably anxiety inducing for the patient.  These areas are not painful.  Repeat noninvasive studies today show lymph nodes measuring 1.75 cm on the right was it was previously 3.43.  The left measures 1.5 cm which previously measured 1.24 cm.    Review of Systems  All other systems reviewed and are negative.      Objective:   Physical Exam Vitals reviewed.  HENT:     Head: Normocephalic.  Cardiovascular:     Rate and Rhythm: Normal rate.  Pulmonary:     Effort: Pulmonary effort is normal.  Skin:    General: Skin is warm and dry.  Neurological:     Mental Status: She is alert and oriented to person, place, and time.  Psychiatric:        Mood and Affect: Mood normal.        Behavior: Behavior normal.        Thought Content: Thought content normal.        Judgment: Judgment normal.     BP 125/79   Pulse 71   Resp 16   Ht 5' (1.524 m)   Wt 186 lb (84.4 kg)   BMI 36.33 kg/m   Past Medical History:  Diagnosis Date   GERD (gastroesophageal reflux disease)    Hypertension    Obesity    RA (rheumatoid arthritis) ([place])     Social History   Socioeconomic History   Marital status: Married    Spouse name: Not on file   Number of children: Not on file   Years of education: Not on file    Highest education level: Not on file  Occupational History   Not on file  Tobacco Use   Smoking status: Never   Smokeless tobacco: Never  Vaping Use   Vaping Use: Never used  Substance and Sexual Activity   Alcohol use: No   Drug use: No   Sexual activity: Yes    Birth control/protection: Condom  Other Topics Concern   Not on file  Social History Narrative   Not on file   Social Determinants of Health   Financial Resource Strain: Not on file  Food Insecurity: Not on file  Transportation Needs: Not on file  Physical Activity: Not on file  Stress: Not on file  Social Connections: Not on file  Intimate Partner Violence: Not on file    Past Surgical History:  Procedure Laterality Date   CESAREAN SECTION  2000   twins   COLONOSCOPY WITH PROPOFOL N/A 06/24/2015   Procedure: COLONOSCOPY WITH PROPOFOL;  Surgeon: Josefine Class, MD;  Location: [facility] ENDOSCOPY;  Service: Endoscopy;  Laterality: N/A;   ESOPHAGOGASTRODUODENOSCOPY (EGD) WITH PROPOFOL N/A 06/24/2015   Procedure: ESOPHAGOGASTRODUODENOSCOPY (EGD) WITH PROPOFOL;  Surgeon: Rodman Key  Elmyra Ricks, MD;  Location: [facility] ENDOSCOPY;  Service: Endoscopy;  Laterality: N/A;   right ureter     TONSILLECTOMY      Family History  Problem Relation Age of Onset   Rheum arthritis Mother    Lung cancer Paternal Grandfather    Lung cancer Father 22   Kidney cancer Maternal Grandmother 64   Breast cancer Neg Hx     Allergies  Allergen Reactions   Penicillins Nausea And Vomiting and Other (See Comments)    Had it as a child Had it as a child Had it as a child Had it as a child        No data to display            CMP  No results found for: "NA", "K", "CL", "CO2", "GLUCOSE", "BUN", "CREATININE", "CALCIUM", "PROT", "ALBUMIN", "AST", "ALT", "ALKPHOS", "BILITOT", "GFRNONAA", "GFRAA"   No results found.     Assessment & Plan:   1. Swollen lymph nodes Today noninvasive studies does show evidence of swollen lymph nodes  however the left side remains approximately the same but the right side has significantly decreased in size.  We had a discussion regarding the enlarged lymph nodes.  The patient has rheumatoid arthritis and has recently had some illness which certainly may have caused the enlargement of the lymph nodes.  I also have suspicion that may be chronically enlarged due to her rheumatoid arthritis.  Given the fact that these have decreased in size is promising.  We discussed biopsy but at this time we will plan on close follow-up in 2 months to see if they have continued to decrease in size.  At that time if there is any increase, we can discuss possible biopsy again.   Current Outpatient Medications on File Prior to Visit  Medication Sig Dispense Refill   ALPHA LIPOIC ACID PO Take by mouth.     Calcium Saccharate (CALCIUM D-GLUCARATE PO) Take by mouth.     cholecalciferol (VITAMIN D) 1000 UNITS tablet Take 1,000 Units by mouth daily.     hydroxychloroquine (PLAQUENIL) 200 MG tablet Take 200 mg by mouth 2 (two) times daily.     magnesium citrate SOLN Take 1 Bottle by mouth once.     Multiple Vitamins-Minerals (PRESERVISION AREDS 2 PO) Take by mouth.     progesterone (PROMETRIUM) 100 MG capsule Take 100 mg by mouth daily.     tretinoin (RETIN-A) 0.05 % cream SMARTSIG:Sparingly Topical Every Morning     Turmeric 400 MG CAPS Take by mouth.     No current facility-administered medications on file prior to visit.    There are no Patient Instructions on file for this visit. No follow-ups on file.   Kris Hartmann, NP

## 2022-06-22 ENCOUNTER — Other Ambulatory Visit (INDEPENDENT_AMBULATORY_CARE_PROVIDER_SITE_OTHER): Payer: Self-pay | Admitting: Nurse Practitioner

## 2022-06-22 ENCOUNTER — Encounter (INDEPENDENT_AMBULATORY_CARE_PROVIDER_SITE_OTHER): Payer: Self-pay

## 2022-06-22 DIAGNOSIS — I890 Lymphedema, not elsewhere classified: Secondary | ICD-10-CM

## 2022-07-17 ENCOUNTER — Other Ambulatory Visit (INDEPENDENT_AMBULATORY_CARE_PROVIDER_SITE_OTHER): Payer: Self-pay | Admitting: Nurse Practitioner

## 2022-07-17 DIAGNOSIS — R599 Enlarged lymph nodes, unspecified: Secondary | ICD-10-CM

## 2022-07-25 ENCOUNTER — Encounter (INDEPENDENT_AMBULATORY_CARE_PROVIDER_SITE_OTHER): Payer: Self-pay | Admitting: Nurse Practitioner

## 2022-07-25 ENCOUNTER — Ambulatory Visit (INDEPENDENT_AMBULATORY_CARE_PROVIDER_SITE_OTHER): Payer: 59 | Admitting: Nurse Practitioner

## 2022-07-25 ENCOUNTER — Other Ambulatory Visit: Payer: Self-pay | Admitting: Obstetrics and Gynecology

## 2022-07-25 ENCOUNTER — Ambulatory Visit (INDEPENDENT_AMBULATORY_CARE_PROVIDER_SITE_OTHER): Payer: 59

## 2022-07-25 VITALS — BP 130/66 | HR 66 | Resp 16 | Ht 60.0 in | Wt 188.0 lb

## 2022-07-25 DIAGNOSIS — R599 Enlarged lymph nodes, unspecified: Secondary | ICD-10-CM

## 2022-07-25 DIAGNOSIS — Z1231 Encounter for screening mammogram for malignant neoplasm of breast: Secondary | ICD-10-CM

## 2022-07-26 ENCOUNTER — Encounter (INDEPENDENT_AMBULATORY_CARE_PROVIDER_SITE_OTHER): Payer: Self-pay | Admitting: Nurse Practitioner

## 2022-07-26 NOTE — Progress Notes (Signed)
Subjective:    Patient ID: Victoria Berry, female    DOB: 18-Sep-1970, 52 y.o.   MRN: [id number] Chief Complaint  Patient presents with   Follow-up    f/u in 3 months with no studies    Victoria Berry is a 52 year old female seen today to evaluate her enlarged lymph nodes.  Initially she had enlarged lymph nodes noted in her bilateral groins.  Today noninvasive studies show she continues to have enlarged lymph nodes and tumor noted versus 1.  However since the patient's last visit she has had a flare of her rheumatoid arthritis and she is also had a cold.  The previous measurement was 1.75 on the one for the right groin.  Today 1 measures 1.49 cm x 0.73 cm and the other measures 2.57 cm x 0.98 cm.  The measurements appear slightly increased from previous exam on 06/04/2022 but smaller than the initial finding on 04/19/2022.  The left has enlarged lymph nodes measuring 1.14 cm x 0.66 cm and the second measuring 1.40 cm x 0.74 cm.  There is essentially no change from the previous exam for the left.  She also notes that she has not had any issues with her swelling in fact it has been somewhat improved and she has been walking more regularly.    Review of Systems  Cardiovascular:  Positive for leg swelling.  All other systems reviewed and are negative.      Objective:   Physical Exam Vitals reviewed.  HENT:     Head: Normocephalic.  Cardiovascular:     Rate and Rhythm: Normal rate.  Pulmonary:     Effort: Pulmonary effort is normal.  Skin:    General: Skin is warm and dry.  Neurological:     Mental Status: She is alert and oriented to person, place, and time.  Psychiatric:        Mood and Affect: Mood normal.        Behavior: Behavior normal.        Thought Content: Thought content normal.        Judgment: Judgment normal.     BP 130/66 (BP Location: Right Arm)   Pulse 66   Resp 16   Ht 5' (1.524 m)   Wt 188 lb (85.3 kg)   BMI 36.72 kg/m   Past  Medical History:  Diagnosis Date   GERD (gastroesophageal reflux disease)    Hypertension    Obesity    RA (rheumatoid arthritis)     Social History   Socioeconomic History   Marital status: Married    Spouse name: Not on file   Number of children: Not on file   Years of education: Not on file   Highest education level: Not on file  Occupational History   Not on file  Tobacco Use   Smoking status: Never   Smokeless tobacco: Never  Vaping Use   Vaping Use: Never used  Substance and Sexual Activity   Alcohol use: No   Drug use: No   Sexual activity: Yes    Birth control/protection: Condom  Other Topics Concern   Not on file  Social History Narrative   Not on file   Social Determinants of Health   Financial Resource Strain: Not on file  Food Insecurity: Not on file  Transportation Needs: Not on file  Physical Activity: Not on file  Stress: Not on file  Social Connections: Not on file  Intimate Partner Violence: Not on file    Past  Surgical History:  Procedure Laterality Date   CESAREAN SECTION  2000   twins   COLONOSCOPY WITH PROPOFOL N/A 06/24/2015   Procedure: COLONOSCOPY WITH PROPOFOL;  Surgeon: Josefine Class, MD;  Location: [facility] ENDOSCOPY;  Service: Endoscopy;  Laterality: N/A;   ESOPHAGOGASTRODUODENOSCOPY (EGD) WITH PROPOFOL N/A 06/24/2015   Procedure: ESOPHAGOGASTRODUODENOSCOPY (EGD) WITH PROPOFOL;  Surgeon: Josefine Class, MD;  Location: [facility] ENDOSCOPY;  Service: Endoscopy;  Laterality: N/A;   right ureter     TONSILLECTOMY      Family History  Problem Relation Age of Onset   Rheum arthritis Mother    Lung cancer Paternal Grandfather    Lung cancer Father 43   Kidney cancer Maternal Grandmother 72   Breast cancer Neg Hx     Allergies  Allergen Reactions   Penicillins Nausea And Vomiting and Other (See Comments)    Had it as a child Had it as a child Had it as a child Had it as a child        No data to display              CMP  No results found for: "NA", "K", "CL", "CO2", "GLUCOSE", "BUN", "CREATININE", "CALCIUM", "PROT", "ALBUMIN", "AST", "ALT", "ALKPHOS", "BILITOT", "GFRNONAA", "GFRAA"   No results found.     Assessment & Plan:   1. Swollen lymph nodes Today noninvasive studies does show evidence of swollen lymph nodes however there does not appear to be a drastic difference in size and she has recently had some flareups that may account for the additional enlarged lymph nodes that have been noted.  Based on this we will not move forward with any biopsy at this time but we will plan on checking her back in 6 months.  However if the patient is noted that if she suddenly begins to go very large painful lymph nodes to let us know.   Current Outpatient Medications on File Prior to Visit  Medication Sig Dispense Refill   ALPHA LIPOIC ACID PO Take by mouth.     Calcium Saccharate (CALCIUM D-GLUCARATE PO) Take by mouth.     cholecalciferol (VITAMIN D) 1000 UNITS tablet Take 1,000 Units by mouth daily.     hydroxychloroquine (PLAQUENIL) 200 MG tablet Take 200 mg by mouth 2 (two) times daily.     magnesium citrate SOLN Take 1 Bottle by mouth once.     Multiple Vitamins-Minerals (PRESERVISION AREDS 2 PO) Take by mouth.     progesterone (PROMETRIUM) 100 MG capsule Take 100 mg by mouth daily.     tretinoin (RETIN-A) 0.05 % cream SMARTSIG:Sparingly Topical Every Morning     Turmeric 400 MG CAPS Take by mouth.     No current facility-administered medications on file prior to visit.    There are no Patient Instructions on file for this visit. No follow-ups on file.   Kris Hartmann, NP

## 2022-08-10 ENCOUNTER — Ambulatory Visit
Admission: RE | Admit: 2022-08-10 | Discharge: 2022-08-10 | Disposition: A | Discharge status: Home / Self Care | Payer: 59 | Source: Ambulatory Visit | Attending: Obstetrics and Gynecology | Admitting: Obstetrics and Gynecology

## 2022-08-10 DIAGNOSIS — Z1231 Encounter for screening mammogram for malignant neoplasm of breast: Secondary | ICD-10-CM | POA: Insufficient documentation

## 2022-10-26 NOTE — Progress Notes (Unsigned)
PCP:  Rica Records, PA-C   No chief complaint on file.    HPI:      [name] is a 52 y.o. G1P0 who LMP was No LMP recorded. (Menstrual status: Irregular Periods)., presents today for her annual examination.  Her menses were Q1-2 months, lasting 7 days, spotting at the end prior to 10/22.  Bleeding lasted about 12 days 10/22 and 20 days this cycle. Flow is light to mod, with small clots, and then with multiple days of spotting only. Had neg pap 11/22. Dysmenorrhea not usually. Occas vasomotor sx. Has lost 61 # in past 1 1/2 yrs but none recently.  Had AUB 3/23 with EM=15 mm on GYN u/s, neg EMB. Declined hormones.   Sex activity: single partner, contraception - condoms , declines other BC. No pain/bleeding. Did OCPs and BC patch in past without side effects. No hx of HTN, DVTs, migraines with aura Last Pap: 02/22/21 Results were: no abnormalities /neg HPV DNA  Hx of STDs: hx of abn pap  Last mammogram: 08/10/22  Results were: normal--routine follow-up in 12 months There is no FH of breast cancer. There is no FH of ovarian cancer. The patient does do self-breast exams.   Tobacco use: The patient denies current or previous tobacco use. Alcohol use: social No drug use.  Exercise: mod active  Colonoscopy with Dr. Shelle Iron at [facility]; due this yr per pt. Getting Q5 yrs due to hx of rectal bleeding in past and FH colon polyps in her dad.   She does get adequate calcium and Vitamin D in her diet.  Labs with PCP   Past Medical History:  Diagnosis Date   GERD (gastroesophageal reflux disease)    Hypertension    Obesity    RA (rheumatoid arthritis) (HCC)     Past Surgical History:  Procedure Laterality Date   CESAREAN SECTION  2000   twins   COLONOSCOPY WITH PROPOFOL N/A 06/24/2015   Procedure: COLONOSCOPY WITH PROPOFOL;  Surgeon: Elnita Maxwell, MD;  Location: [facility] ENDOSCOPY;  Service: Endoscopy;  Laterality: N/A;   ESOPHAGOGASTRODUODENOSCOPY (EGD) WITH  PROPOFOL N/A 06/24/2015   Procedure: ESOPHAGOGASTRODUODENOSCOPY (EGD) WITH PROPOFOL;  Surgeon: Elnita Maxwell, MD;  Location: [facility] ENDOSCOPY;  Service: Endoscopy;  Laterality: N/A;   right ureter     TONSILLECTOMY      Family History  Problem Relation Age of Onset   Rheum arthritis Mother    Lung cancer Paternal Grandfather    Lung cancer Father 6   Kidney cancer Maternal Grandmother 25   Breast cancer Neg Hx     Social History   Socioeconomic History   Marital status: Married    Spouse name: Not on file   Number of children: Not on file   Years of education: Not on file   Highest education level: Not on file  Occupational History   Not on file  Tobacco Use   Smoking status: Never   Smokeless tobacco: Never  Vaping Use   Vaping Use: Never used  Substance and Sexual Activity   Alcohol use: No   Drug use: No   Sexual activity: Yes    Birth control/protection: Condom  Other Topics Concern   Not on file  Social History Narrative   Not on file   Social Determinants of Health   Financial Resource Strain: Not on file  Food Insecurity: Not on file  Transportation Needs: Not on file  Physical Activity: Not on file  Stress: Not  on file  Social Connections: Not on file  Intimate Partner Violence: Not on file    No outpatient medications have been marked as taking for the 10/29/22 encounter (Appointment) with Ian Castagna, Ilona Sorrel, PA-C.     ROS:  Review of Systems  Constitutional:  Negative for fatigue, fever and unexpected weight change.  Respiratory:  Negative for cough, shortness of breath and wheezing.   Cardiovascular:  Negative for chest pain, palpitations and leg swelling.  Gastrointestinal:  Negative for blood in stool, constipation, diarrhea, nausea and vomiting.  Endocrine: Negative for cold intolerance, heat intolerance and polyuria.  Genitourinary:  Positive for menstrual problem. Negative for dyspareunia, dysuria, flank pain, frequency, genital sores,  hematuria, pelvic pain, urgency, vaginal bleeding, vaginal discharge and vaginal pain.  Musculoskeletal:  Negative for back pain, joint swelling and myalgias.  Skin:  Negative for rash.  Neurological:  Negative for dizziness, syncope, light-headedness, numbness and headaches.  Hematological:  Negative for adenopathy.  Psychiatric/Behavioral:  Negative for agitation, confusion, sleep disturbance and suicidal ideas. The patient is not nervous/anxious.      Objective: There were no vitals taken for this visit.  Physical Exam  Physical Exam Constitutional:      Appearance: She is well-developed.  Genitourinary:     Vulva normal.     Right Labia: No rash, tenderness or lesions.    Left Labia: No tenderness, lesions or rash.    No vaginal discharge, erythema or tenderness.      Right Adnexa: not tender and no mass present.    Left Adnexa: not tender and no mass present.    No cervical friability or polyp.     Uterus is not enlarged or tender.  Breasts:    Right: No mass, nipple discharge, skin change or tenderness.     Left: No mass, nipple discharge, skin change or tenderness.  Neck:     Thyroid: No thyromegaly.  Cardiovascular:     Rate and Rhythm: Normal rate and regular rhythm.     Heart sounds: Normal heart sounds. No murmur heard. Pulmonary:     Effort: Pulmonary effort is normal.     Breath sounds: Normal breath sounds.  Abdominal:     Palpations: Abdomen is soft.     Tenderness: There is no abdominal tenderness. There is no guarding or rebound.  Musculoskeletal:        General: Normal range of motion.     Cervical back: Normal range of motion.  Lymphadenopathy:     Cervical: No cervical adenopathy.  Neurological:     General: No focal deficit present.     Mental Status: She is alert and oriented to person, place, and time.     Cranial Nerves: No cranial nerve deficit.  Skin:    General: Skin is warm and dry.  Psychiatric:        Mood and Affect: Mood normal.         Behavior: Behavior normal.        Thought Content: Thought content normal.        Judgment: Judgment normal.  Vitals reviewed.     Assessment/Plan: Encounter for annual routine gynecological examination  Encounter for screening mammogram for malignant neoplasm of breast - Plan: MM 3D SCREEN BREAST BILATERAL; [facility]  Abnormal uterine bleeding (AUB) - Plan: US PELVIC COMPLETE WITH TRANSVAGINAL; neg pap, neg exam. Check GYN u/s. Will f/u with results and mgmt. If neg, most likely perimenopausal bleeding.  Screening for colon cancer--pt to sched  colonoscopy. F/u for ref prn.   GYN counsel breast self exam, mammography screening, adequate intake of calcium and vitamin D, diet and exercise     F/U  No follow-ups on file.  Clemmie Marxen B. Nelline Lio, PA-C 10/26/2022 1:22 PM

## 2022-10-29 ENCOUNTER — Encounter: Payer: Self-pay | Admitting: Obstetrics and Gynecology

## 2022-10-29 ENCOUNTER — Ambulatory Visit (INDEPENDENT_AMBULATORY_CARE_PROVIDER_SITE_OTHER): Payer: 59 | Admitting: Obstetrics and Gynecology

## 2022-10-29 VITALS — BP 120/84 | Ht 60.0 in | Wt 194.0 lb

## 2022-10-29 DIAGNOSIS — Z01419 Encounter for gynecological examination (general) (routine) without abnormal findings: Secondary | ICD-10-CM | POA: Diagnosis not present

## 2022-10-29 DIAGNOSIS — Z1211 Encounter for screening for malignant neoplasm of colon: Secondary | ICD-10-CM

## 2022-10-29 DIAGNOSIS — Z1231 Encounter for screening mammogram for malignant neoplasm of breast: Secondary | ICD-10-CM

## 2022-10-29 DIAGNOSIS — N939 Abnormal uterine and vaginal bleeding, unspecified: Secondary | ICD-10-CM

## 2022-10-29 NOTE — Patient Instructions (Signed)
I value your feedback and you entrusting us with your care. If you get a [facility] patient survey, I would appreciate you taking the time to let us know about your experience today. Thank you! ? ? ?

## 2022-11-28 ENCOUNTER — Telehealth: Payer: Self-pay

## 2022-11-28 ENCOUNTER — Other Ambulatory Visit: Payer: Self-pay | Admitting: Obstetrics and Gynecology

## 2022-11-28 MED ORDER — PROGESTERONE MICRONIZED 100 MG PO CAPS
100.0000 mg | ORAL_CAPSULE | Freq: Every day | ORAL | 3 refills | Status: DC
Start: 2022-11-28 — End: 2023-11-14

## 2022-11-28 NOTE — Telephone Encounter (Signed)
Rx RF eRxd.  

## 2022-11-28 NOTE — Telephone Encounter (Signed)
Pt called triage asking to leave msg for [facility]. She wants to let [facility] know the provider managing her progesterone is no longer managing it, if you could manage it and send it to her [facility] pharmacy? She still has a week worth left and she is aware you are out of the office.

## 2022-11-28 NOTE — Progress Notes (Signed)
Rx RF prometrium at bedtime since previous provider no longer managing

## 2022-11-29 NOTE — Telephone Encounter (Signed)
Called pt, no answer, left detailed msg stating Rx sent to pharmacy.

## 2023-01-17 ENCOUNTER — Other Ambulatory Visit (INDEPENDENT_AMBULATORY_CARE_PROVIDER_SITE_OTHER): Payer: Self-pay | Admitting: Nurse Practitioner

## 2023-01-17 DIAGNOSIS — R599 Enlarged lymph nodes, unspecified: Secondary | ICD-10-CM

## 2023-01-23 ENCOUNTER — Ambulatory Visit (INDEPENDENT_AMBULATORY_CARE_PROVIDER_SITE_OTHER): Payer: 59

## 2023-01-23 ENCOUNTER — Encounter (INDEPENDENT_AMBULATORY_CARE_PROVIDER_SITE_OTHER): Payer: Self-pay | Admitting: Nurse Practitioner

## 2023-01-23 ENCOUNTER — Ambulatory Visit (INDEPENDENT_AMBULATORY_CARE_PROVIDER_SITE_OTHER): Payer: 59 | Admitting: Nurse Practitioner

## 2023-01-23 VITALS — BP 119/67 | HR 66 | Resp 16 | Wt 191.8 lb

## 2023-01-23 DIAGNOSIS — I890 Lymphedema, not elsewhere classified: Secondary | ICD-10-CM

## 2023-01-23 DIAGNOSIS — R599 Enlarged lymph nodes, unspecified: Secondary | ICD-10-CM | POA: Diagnosis not present

## 2023-01-23 NOTE — Progress Notes (Signed)
Subjective:    Patient ID: Victoria Berry, female    DOB: 07-Jul-1970, 52 y.o.   MRN: [id number] Chief Complaint  Patient presents with   Follow-up    Ultrasound follow up    The patient returns to the office for followup evaluation regarding leg swelling.  The swelling has improved but she still continues to have struggles with maintaining the swelling especially as it goes on throughout the day.  She has been very diligent with wearing her compression socks daily in addition to getting lymphatic massages on a monthly basis.  She is also been diligent with other factors of conservative care such as elevation and activity and exercise.  There have not been any interval development of a ulcerations or wounds.  She recently did have another flare of her rheumatoid arthritis but for the most part it has been fairly well-controlled.  She also has some noted swollen lymph nodes which are consistent with her previous studies.  Her right groin measures 1.07 cm x 1.3 cm and 2.23 cm x 0.66 cm.  The left groin measures 1.04 cm x 0.54 cm and 0.93 cm x 0.65 cm bilaterally the enlarged lymph nodes appear to be decreased in size compared to the previous study done on 07/25/2022.       Review of Systems  Cardiovascular:  Positive for leg swelling.  Skin:  Negative for wound.  All other systems reviewed and are negative.      Objective:   Physical Exam Vitals reviewed.  HENT:     Head: Normocephalic.  Cardiovascular:     Rate and Rhythm: Normal rate.  Pulmonary:     Effort: Pulmonary effort is normal.  Musculoskeletal:     Right lower leg: Edema present.     Left lower leg: Edema present.  Skin:    General: Skin is warm and dry.  Neurological:     Mental Status: She is alert and oriented to person, place, and time.  Psychiatric:        Mood and Affect: Mood normal.        Behavior: Behavior normal.        Thought Content: Thought content normal.        Judgment: Judgment  normal.     BP 119/67 (BP Location: Left Arm)   Pulse 66   Resp 16   Wt 191 lb 12.8 oz (87 kg)   BMI 37.46 kg/m   Past Medical History:  Diagnosis Date   GERD (gastroesophageal reflux disease)    Hypertension    Obesity    RA (rheumatoid arthritis) (HCC)     Social History   Socioeconomic History   Marital status: Married    Spouse name: Not on file   Number of children: Not on file   Years of education: Not on file   Highest education level: Not on file  Occupational History   Not on file  Tobacco Use   Smoking status: Never   Smokeless tobacco: Never  Vaping Use   Vaping status: Never Used  Substance and Sexual Activity   Alcohol use: No   Drug use: No   Sexual activity: Yes    Birth control/protection: Condom  Other Topics Concern   Not on file  Social History Narrative   Not on file   Social Determinants of Health   Financial Resource Strain: Not on file  Food Insecurity: Not on file  Transportation Needs: Not on file  Physical Activity: Not on file  Stress: Not on file  Social Connections: Not on file  Intimate Partner Violence: Not on file    Past Surgical History:  Procedure Laterality Date   CESAREAN SECTION  2000   twins   COLONOSCOPY WITH PROPOFOL N/A 06/24/2015   Procedure: COLONOSCOPY WITH PROPOFOL;  Surgeon: Elnita Maxwell, MD;  Location: [facility] ENDOSCOPY;  Service: Endoscopy;  Laterality: N/A;   ESOPHAGOGASTRODUODENOSCOPY (EGD) WITH PROPOFOL N/A 06/24/2015   Procedure: ESOPHAGOGASTRODUODENOSCOPY (EGD) WITH PROPOFOL;  Surgeon: Elnita Maxwell, MD;  Location: [facility] ENDOSCOPY;  Service: Endoscopy;  Laterality: N/A;   right ureter     TONSILLECTOMY      Family History  Problem Relation Age of Onset   Rheum arthritis Mother    Lung cancer Paternal Grandfather    Lung cancer Father 36   Kidney cancer Maternal Grandmother 61   Breast cancer Neg Hx     Allergies  Allergen Reactions   Penicillins Nausea And Vomiting and Other (See  Comments)    Had it as a child Had it as a child Had it as a child Had it as a child        No data to display            CMP  No results found for: "NA", "K", "CL", "CO2", "GLUCOSE", "BUN", "CREATININE", "CALCIUM", "PROT", "ALBUMIN", "AST", "ALT", "ALKPHOS", "BILITOT", "GFR", "EGFR", "GFRNONAA"   No results found.     Assessment & Plan:   1. Lymphedema Recommend:  No surgery or intervention at this point in time.   The Patient is CEAP [id number].  The patient has been wearing compression for more than 12 weeks with no or little benefit.  The patient has been exercising daily for more than 12 weeks. The patient has been elevating and taking OTC pain medications for more than 12 weeks.  None of these have have eliminated the pain related to the lymphedema or the discomfort regarding excessive swelling and venous congestion.    I have reviewed my discussion with the patient regarding lymphedema and why it  causes symptoms.  Patient will continue wearing graduated compression on a daily basis. The patient should put the compression on first thing in the morning and removing them in the evening. The patient should not sleep in the compression.   In addition, behavioral modification throughout the day will be continued.  This will include frequent elevation (such as in a recliner), use of over the counter pain medications as needed and exercise such as walking.  The systemic causes for chronic edema such as liver, kidney and cardiac etiologies do not appear to have significant changed over the past year.    The patient has chronic , severe lymphedema with hyperpigmentation of the skin and has done MLD, skin care, medication, diet, exercise, elevation and compression for 4 weeks with no improvement,  I am recommending a lymphedema pump.  The patient still has stage 3 lymphedema and therefore, I believe that a lymph pump is needed to improve the control of the patient's lymphedema and  improve the quality of life.  Additionally, a lymph pump is warranted because it will reduce the risk of cellulitis and ulceration in the future.  Patient should follow-up in six months   2. Enlarged lymph node Today noninvasive studies show that the lymph nodes are roughly the same size as they were last visit.  We will continue to monitor her and repeat in 6 months given her chronic inflammatory conditions.   Current Outpatient  Medications on File Prior to Visit  Medication Sig Dispense Refill   ALPHA LIPOIC ACID PO Take by mouth.     Calcium Saccharate (CALCIUM D-GLUCARATE PO) Take by mouth.     cholecalciferol (VITAMIN D) 1000 UNITS tablet Take 1,000 Units by mouth daily.     hydroxychloroquine (PLAQUENIL) 200 MG tablet Take 200 mg by mouth 2 (two) times daily.     MAGNESIUM PO Take by mouth.     Multiple Vitamins-Minerals (PRESERVISION AREDS 2 PO) Take by mouth.     Omega-3 Fatty Acids (OMEGA-3 FISH OIL PO) Take by mouth.     progesterone (PROMETRIUM) 100 MG capsule Take 1 capsule (100 mg total) by mouth daily. 90 capsule 3   Turmeric 400 MG CAPS Take by mouth.     No current facility-administered medications on file prior to visit.    There are no Patient Instructions on file for this visit. No follow-ups on file.   Georgiana Spinner, NP

## 2023-02-21 ENCOUNTER — Encounter (INDEPENDENT_AMBULATORY_CARE_PROVIDER_SITE_OTHER): Payer: Self-pay

## 2023-06-25 NOTE — Therapy (Signed)
 OUTPATIENT PHYSICAL THERAPY LOWER EXTREMITY EVALUATION   Patient Name: Victoria Berry MRN: [id number] DOB:[birth year], 53 y.o., female Today's Date: 06/27/2023  END OF SESSION:  PT End of Session - 06/26/23 1539     Visit Number 1    Number of Visits 16    Date for PT Re-Evaluation 08/21/23    PT Start Time 1314    PT Stop Time 1359    PT Time Calculation (min) 45 min    Activity Tolerance Patient tolerated treatment well;Patient limited by pain    Behavior During Therapy [facility] for tasks assessed/performed             Past Medical History:  Diagnosis Date   GERD (gastroesophageal reflux disease)    Hypertension    Obesity    RA (rheumatoid arthritis) (HCC)    Past Surgical History:  Procedure Laterality Date   CESAREAN SECTION  2000   twins   COLONOSCOPY WITH PROPOFOL N/A 06/24/2015   Procedure: COLONOSCOPY WITH PROPOFOL;  Surgeon: Elnita Maxwell, MD;  Location: [facility] ENDOSCOPY;  Service: Endoscopy;  Laterality: N/A;   ESOPHAGOGASTRODUODENOSCOPY (EGD) WITH PROPOFOL N/A 06/24/2015   Procedure: ESOPHAGOGASTRODUODENOSCOPY (EGD) WITH PROPOFOL;  Surgeon: Elnita Maxwell, MD;  Location: [facility] ENDOSCOPY;  Service: Endoscopy;  Laterality: N/A;   right ureter     TONSILLECTOMY     Patient Active Problem List   Diagnosis Date Noted   Kidney cyst, acquired 03/14/2022   Intermittent palpitations 06/03/2019   Pulsatile tinnitus, right ear 12/25/2017   Anxiety state 01/16/2017   Major depression, melancholic type 01/16/2017   Acquired equinus deformity of both feet 10/25/2016   Chronic pain of right ankle 04/25/2016   Morbid obesity with BMI of 45.0-49.9, adult (HCC) 12/21/2015   CRP elevated 07/20/2015   Encounter for long-term (current) use of high-risk medication 04/20/2015   Mild obesity 04/20/2015   Rheumatoid arthritis of multiple sites with negative rheumatoid factor (HCC) 04/20/2015   Macular degeneration, dry 02/12/2014   GERD (gastroesophageal reflux  disease) 11/27/2013    PCP: Althea Grimmer  REFERRING PROVIDER: Milderd Meager   REFERRING DIAG: Peroneal tendonitis   THERAPY DIAG:  Pain in right ankle and joints of right foot - Plan: PT plan of care cert/re-cert  Difficulty in walking, not elsewhere classified - Plan: PT plan of care cert/re-cert  Stiffness of right ankle, not elsewhere classified - Plan: PT plan of care cert/re-cert  Rationale for Evaluation and Treatment: Rehabilitation  ONSET DATE: 5 years ago   SUBJECTIVE:   SUBJECTIVE STATEMENT: Patient referred for 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 Physician requests additional checking of hip and LE ROM.  PMH includes stage 2 moderate rheumatoid arthritis, lymphedema, HTN, GERD.  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 Has flair ups. 5 years ago noticed R foot arch falling. Walk with in toe when plantar surface hurts.  Has good history of dry needling PAIN:  Are you having pain? Yes: NPRS scale: 9/10 Pain location: heel, plantar surfaces Pain description: stabbing  Aggravating factors: dressing Relieving factors: n/a, rest   Plantar surface: worst 9/10 Midfoot, arch and medial tendons: worst 7/10   PRECAUTIONS: None  RED FLAGS: None   WEIGHT BEARING RESTRICTIONS: No  FALLS:  Has patient fallen in last 6 months? No  LIVING ENVIRONMENT: Lives with: lives with their family Lives in: House/apartment Stairs: No  OCCUPATION: dental hygienist   PLOF: Independent  PATIENT GOALS: to go to the gym   NEXT MD VISIT: n/a  OBJECTIVE:  Note: Objective measures were completed at Evaluation unless otherwise noted.  DIAGNOSTIC FINDINGS: n/a  PATIENT SURVEYS:  LEFS 55  COGNITION: Overall cognitive status: Within functional limits for tasks assessed    EDEMA:  Hx of active  lymphedema management in bilateral LE  MUSCLE LENGTH: Limited calf length: see AROM resting position below  POSTURE: weight shift left  PALPATION: Tenderness to plantar aspect of foot and dorsal region of talus   LOWER EXTREMITY ROM:  Active ROM Right eval Left eval  Hip flexion    Hip extension    Hip abduction    Hip adduction    Hip internal rotation    Hip external rotation    Knee flexion    Knee extension    Ankle dorsiflexion AROM: -12 PROM; -2 AROM 5 PROM 10  Ankle plantarflexion AROM: 5 degrees actively from neutral position AROM 30  Ankle inversion    Ankle eversion     (Blank rows = not tested)  Neutral position: 39 R foot   Trunk Flexion WFL  Trunk Extension WFL  Trunk R SB WFL  Trunk L SB WFL  Trunk R rotation WFL  Trunk L rotation WFL    LOWER EXTREMITY MMT: Test Next Session   MMT Right eval Left eval  Hip flexion    Hip extension    Hip abduction    Hip adduction    Hip internal rotation    Hip external rotation    Knee flexion    Knee extension    Ankle dorsiflexion    Ankle plantarflexion    Ankle inversion    Ankle eversion     (Blank rows = not tested)  LOWER EXTREMITY SPECIAL TESTS:  Ankle special test:  Eversion stress test: negative  Anterior drawer test :positive Talar tilt test: positive  FUNCTIONAL TESTS:  10 meter walk test: 8 seconds with antalgic gait pattern  GAIT: Distance walked: 40 ft Assistive device utilized: None Level of assistance: SBA and CGA Comments: severe antalgic gait pattern with significant weight shift to LLE.                                                                                                                                 TREATMENT DATE: 06/27/23 Eval and HEP     PATIENT EDUCATION:  Education details: goals, POC, HEP  Person educated: Patient Education method: Explanation, Demonstration, Tactile cues, Verbal cues, and Handouts Education comprehension: verbalized  understanding, returned demonstration, verbal cues required, tactile cues required, and needs further education  HOME EXERCISE PROGRAM: Access Code: [email or web address] URL: [email or web address] Date: 06/26/2023 Prepared by: Precious Bard  Exercises - Seated Great Toe Extension  - 1 x daily - 7 x weekly - 2 sets - 10 reps - 5 hold - Seated Calf Stretch with Strap  -  1 x daily - 7 x weekly - 2 sets - 2 reps - 30 hold - Seated Ankle Alphabet  - 1 x daily - 7 x weekly - 2 sets - 10 reps - 5 hold  ASSESSMENT:  CLINICAL IMPRESSION: Patient is a 53 y.o. 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  Patient has signs and symptoms of plantar fascitis as well as ankle instability and anterior shifted talocrural joint in an anterior translation. Limited R ankle ROM noted.  Will assess hip and manual muscle testing next session. Patient will benefit from skilled physical therapy to reduce pain, improve functional mobility, and return to PLOF.   OBJECTIVE IMPAIRMENTS: Abnormal gait, decreased activity tolerance, decreased coordination, decreased endurance, decreased mobility, difficulty walking, decreased ROM, decreased strength, hypomobility, increased edema, increased fascial restrictions, impaired perceived functional ability, increased muscle spasms, impaired flexibility, improper body mechanics, postural dysfunction, and pain.   ACTIVITY LIMITATIONS: carrying, lifting, sitting, standing, squatting, stairs, transfers, hygiene/grooming, locomotion level, and caring for others  PARTICIPATION LIMITATIONS: meal prep, cleaning, laundry, driving, shopping, community activity, occupation, and yard work  PERSONAL FACTORS: Age, Behavior pattern, Past/current experiences, Profession, Time since onset of injury/illness/exacerbation, and 3+ comorbidities: stage 2 moderate rheumatoid arthritis, lymphedema, HTN,  GERD  are also affecting patient's functional outcome.   REHAB POTENTIAL: Good  CLINICAL DECISION MAKING: Evolving/moderate complexity  EVALUATION COMPLEXITY: Moderate   GOALS: Goals reviewed with patient? Yes  SHORT TERM GOALS: Target date: 07/24/2023   Patient will be independent in home exercise program to improve strength/mobility for better functional independence with ADLs. Baseline: Goal status: INITIAL    LONG TERM GOALS: Target date: 08/21/2023   Patient will report a worst pain of 3/10 on VAS in plantar aspect of foot to improve tolerance with ADLs and reduced symptoms with activities.  Baseline: 3/5: 9/10  Goal status: INITIAL  2.  Patient will achieve >10 degrees of DF to improve foot clearance and positioning.  Baseline: -12 AROM  Goal status: INITIAL  3.  Patient will return to gym program with no limitations to return to PLOF. Baseline:  3/5: unable to go to gym  Goal status: INITIAL  4.  Patient will increase lower extremity functional scale to >70/80 to demonstrate improved functional mobility and increased tolerance with ADLs.  Baseline:  3/5: 55/80  Goal status: INITIAL     PLAN:  PT FREQUENCY: 2x/week  PT DURATION: 8 weeks  PLANNED INTERVENTIONS: 97164- PT Re-evaluation, 97110-Therapeutic exercises, 97530- Therapeutic activity, 97112- Neuromuscular re-education, 97535- Self Care, 06301- Manual therapy, [id number]- Gait training, [id number]- Orthotic Fit/training, [phone]- Canalith repositioning, [id number]- Splinting, D3220- Electrical stimulation (unattended), [phone]- Electrical stimulation (manual), 97016- Vasopneumatic device, [id number]- Ultrasound, [id number]- Traction (mechanical), Patient/Family education, Balance training, Stair training, Taping, Dry Needling, Joint mobilization, Joint manipulation, Spinal mobilization, Compression bandaging, Vestibular training, Visual/preceptual remediation/compensation, DME instructions, Cryotherapy, and Moist heat  PLAN FOR NEXT  SESSION: needle anterior calf, iastm to plantar surface of foot, mobilize anterior shift to neutral alignment , test MMT    Precious Bard, PT 06/27/2023, 10:09 AM

## 2023-06-26 ENCOUNTER — Ambulatory Visit

## 2023-06-26 DIAGNOSIS — R262 Difficulty in walking, not elsewhere classified: Secondary | ICD-10-CM

## 2023-06-26 DIAGNOSIS — M25671 Stiffness of right ankle, not elsewhere classified: Secondary | ICD-10-CM

## 2023-06-26 DIAGNOSIS — M25571 Pain in right ankle and joints of right foot: Secondary | ICD-10-CM

## 2023-07-02 NOTE — Therapy (Signed)
 OUTPATIENT PHYSICAL THERAPY LOWER EXTREMITY TREATMENT   Patient Name: Victoria Berry MRN: [id number] DOB:February 02, 1971, 53 y.o., female Today's Date: 07/03/2023  END OF SESSION:  PT End of Session - 07/03/23 0846     Visit Number 2    Number of Visits 16    Date for PT Re-Evaluation 08/21/23    PT Start Time 0845    PT Stop Time 0929    PT Time Calculation (min) 44 min    Activity Tolerance Patient tolerated treatment well;Patient limited by pain    Behavior During Therapy [facility] for tasks assessed/performed              Past Medical History:  Diagnosis Date   GERD (gastroesophageal reflux disease)    Hypertension    Obesity    RA (rheumatoid arthritis) (HCC)    Past Surgical History:  Procedure Laterality Date   CESAREAN SECTION  2000   twins   COLONOSCOPY WITH PROPOFOL N/A 06/24/2015   Procedure: COLONOSCOPY WITH PROPOFOL;  Surgeon: Elnita Maxwell, MD;  Location: [facility] ENDOSCOPY;  Service: Endoscopy;  Laterality: N/A;   ESOPHAGOGASTRODUODENOSCOPY (EGD) WITH PROPOFOL N/A 06/24/2015   Procedure: ESOPHAGOGASTRODUODENOSCOPY (EGD) WITH PROPOFOL;  Surgeon: Elnita Maxwell, MD;  Location: [facility] ENDOSCOPY;  Service: Endoscopy;  Laterality: N/A;   right ureter     TONSILLECTOMY     Patient Active Problem List   Diagnosis Date Noted   Kidney cyst, acquired 03/14/2022   Intermittent palpitations 06/03/2019   Pulsatile tinnitus, right ear 12/25/2017   Anxiety state 01/16/2017   Major depression, melancholic type 01/16/2017   Acquired equinus deformity of both feet 10/25/2016   Chronic pain of right ankle 04/25/2016   Morbid obesity with BMI of 45.0-49.9, adult (HCC) 12/21/2015   CRP elevated 07/20/2015   Encounter for long-term (current) use of high-risk medication 04/20/2015   Mild obesity 04/20/2015   Rheumatoid arthritis of multiple sites with negative rheumatoid factor (HCC) 04/20/2015   Macular degeneration, dry 02/12/2014   GERD (gastroesophageal reflux  disease) 11/27/2013    PCP: Althea Grimmer  REFERRING PROVIDER: Milderd Meager   REFERRING DIAG: Peroneal tendonitis   THERAPY DIAG:  Difficulty in walking, not elsewhere classified  Stiffness of right ankle, not elsewhere classified  Pain in right ankle and joints of right foot  Rationale for Evaluation and Treatment: Rehabilitation  ONSET DATE: 5 years ago   SUBJECTIVE:   SUBJECTIVE STATEMENT: Patient reports compliance with icing. Having difficulty with moving foot by end of day.   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 Physician requests additional checking of hip and LE ROM.  PMH includes stage 2 moderate rheumatoid arthritis, lymphedema, HTN, GERD.  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 Has flair ups. 5 years ago noticed R foot arch falling. Walk with in toe when plantar surface hurts.  Has good history of dry needling PAIN:  Are you having pain? Yes: NPRS scale: 9/10 Pain location: heel, plantar surfaces Pain description: stabbing  Aggravating factors: dressing Relieving factors: n/a, rest   Plantar surface: worst 9/10 Midfoot, arch and medial tendons: worst 7/10   PRECAUTIONS: None  RED FLAGS: None   WEIGHT BEARING RESTRICTIONS: No  FALLS:  Has patient fallen in last 6 months? No  LIVING ENVIRONMENT: Lives with: lives with their family Lives in: House/apartment Stairs: No   OCCUPATION: Armed forces operational officer   PLOF: Independent  PATIENT GOALS: to go to the gym  NEXT MD VISIT: n/a  OBJECTIVE:  Note: Objective measures were completed at Evaluation unless otherwise noted.  DIAGNOSTIC FINDINGS: n/a  PATIENT SURVEYS:  LEFS 55  COGNITION: Overall cognitive status: Within functional limits for tasks assessed    EDEMA:  Hx of active lymphedema management in bilateral LE  MUSCLE LENGTH: Limited calf length: see AROM resting position  below  POSTURE: weight shift left  PALPATION: Tenderness to plantar aspect of foot and dorsal region of talus   LOWER EXTREMITY ROM:  Active ROM Right eval Left eval  Hip flexion    Hip extension    Hip abduction    Hip adduction    Hip internal rotation    Hip external rotation    Knee flexion    Knee extension    Ankle dorsiflexion AROM: -12 PROM; -2 AROM 5 PROM 10  Ankle plantarflexion AROM: 5 degrees actively from neutral position AROM 30  Ankle inversion    Ankle eversion     (Blank rows = not tested)  Neutral position: 39 R foot   Trunk Flexion WFL  Trunk Extension WFL  Trunk R SB WFL  Trunk L SB WFL  Trunk R rotation WFL  Trunk L rotation WFL    LOWER EXTREMITY MMT: Test Next Session   MMT Right eval Left eval  Hip flexion    Hip extension    Hip abduction    Hip adduction    Hip internal rotation    Hip external rotation    Knee flexion    Knee extension    Ankle dorsiflexion    Ankle plantarflexion    Ankle inversion    Ankle eversion     (Blank rows = not tested)  LOWER EXTREMITY SPECIAL TESTS:  Ankle special test:  Eversion stress test: negative  Anterior drawer test :positive Talar tilt test: positive  FUNCTIONAL TESTS:  10 meter walk test: 8 seconds with antalgic gait pattern  GAIT: Distance walked: 40 ft Assistive device utilized: None Level of assistance: SBA and CGA Comments: severe antalgic gait pattern with significant weight shift to LLE.                                                                                                                                 TREATMENT DATE: 07/03/23 Manual:  iastm to plantar surface of foot x 8 minutes with multiple areas of adhesions. Calcaneal distraction 3x30 seconds calcaneal medial/lateral mobilization: multiple repetitions Talocrural AP: x multiple repetitions Df with AP mobilization x multiple repetitions with foot on PT leg  Tape plantar fascia for pain reduction and  stabilization  TherEx Big toe extension 10x Eversion/inversion ankle AROM 10x   Trigger Point Dry Needling  Initial Treatment: Pt instructed on Dry Needling rational, procedures, and possible side effects. Pt instructed to expect mild to moderate muscle soreness later in the day and/or into the next day.  Pt instructed in methods to reduce muscle soreness. Pt  instructed to continue prescribed HEP. Patient was educated on signs and symptoms of infection and other risk factors and advised to seek medical attention should they occur.  Patient verbalized understanding of these instructions and education.   Patient Verbal Consent Given: Yes Education Handout Provided: Yes Muscles Treated: anterior calf: tibialis anterior, extensor digitorum longus Electrical Stimulation Performed: No Treatment Response/Outcome: good response, no adverse reactions     PATIENT EDUCATION:  Education details: goals, POC, HEP  Person educated: Patient Education method: Explanation, Demonstration, Tactile cues, Verbal cues, and Handouts Education comprehension: verbalized understanding, returned demonstration, verbal cues required, tactile cues required, and needs further education  HOME EXERCISE PROGRAM: Access Code: [email or web address] URL: [email or web address] Date: 06/26/2023 Prepared by: Precious Bard  Exercises - Seated Great Toe Extension  - 1 x daily - 7 x weekly - 2 sets - 10 reps - 5 hold - Seated Calf Stretch with Strap  - 1 x daily - 7 x weekly - 2 sets - 2 reps - 30 hold - Seated Ankle Alphabet  - 1 x daily - 7 x weekly - 2 sets - 10 reps - 5 hold  ASSESSMENT:  CLINICAL IMPRESSION: Patient is highly motivated thoughout session. She has multiple adhesions that are released with manual technique. Multiple cavitations with distraction noted. Ankle stabilization training will be beneficial next session.  Patient will benefit from skilled physical therapy to reduce pain, improve functional  mobility, and return to PLOF.   OBJECTIVE IMPAIRMENTS: Abnormal gait, decreased activity tolerance, decreased coordination, decreased endurance, decreased mobility, difficulty walking, decreased ROM, decreased strength, hypomobility, increased edema, increased fascial restrictions, impaired perceived functional ability, increased muscle spasms, impaired flexibility, improper body mechanics, postural dysfunction, and pain.   ACTIVITY LIMITATIONS: carrying, lifting, sitting, standing, squatting, stairs, transfers, hygiene/grooming, locomotion level, and caring for others  PARTICIPATION LIMITATIONS: meal prep, cleaning, laundry, driving, shopping, community activity, occupation, and yard work  PERSONAL FACTORS: Age, Behavior pattern, Past/current experiences, Profession, Time since onset of injury/illness/exacerbation, and 3+ comorbidities: stage 2 moderate rheumatoid arthritis, lymphedema, HTN, GERD  are also affecting patient's functional outcome.   REHAB POTENTIAL: Good  CLINICAL DECISION MAKING: Evolving/moderate complexity  EVALUATION COMPLEXITY: Moderate   GOALS: Goals reviewed with patient? Yes  SHORT TERM GOALS: Target date: 07/24/2023   Patient will be independent in home exercise program to improve strength/mobility for better functional independence with ADLs. Baseline: Goal status: INITIAL    LONG TERM GOALS: Target date: 08/21/2023   Patient will report a worst pain of 3/10 on VAS in plantar aspect of foot to improve tolerance with ADLs and reduced symptoms with activities.  Baseline: 3/5: 9/10  Goal status: INITIAL  2.  Patient will achieve >10 degrees of DF to improve foot clearance and positioning.  Baseline: -12 AROM  Goal status: INITIAL  3.  Patient will return to gym program with no limitations to return to PLOF. Baseline:  3/5: unable to go to gym  Goal status: INITIAL  4.  Patient will increase lower extremity functional scale to >70/80 to demonstrate  improved functional mobility and increased tolerance with ADLs.  Baseline:  3/5: 55/80  Goal status: INITIAL     PLAN:  PT FREQUENCY: 2x/week  PT DURATION: 8 weeks  PLANNED INTERVENTIONS: 97164- PT Re-evaluation, 97110-Therapeutic exercises, 97530- Therapeutic activity, [id number]- Neuromuscular re-education, 97535- Self Care, 16109- Manual therapy, [id number]- Gait training, [phone]- Orthotic Fit/training, [id number]- Canalith repositioning, [id number]- Splinting, B1478- Electrical stimulation (unattended), [id number]- Electrical stimulation (manual), [id number]- Vasopneumatic device, [id number]- Ultrasound, [id number]- Traction (mechanical), Patient/Family education,  Balance training, Stair training, Taping, Dry Needling, Joint mobilization, Joint manipulation, Spinal mobilization, Compression bandaging, Vestibular training, Visual/preceptual remediation/compensation, DME instructions, Cryotherapy, and Moist heat  PLAN FOR NEXT SESSION: needle anterior calf, iastm to plantar surface of foot, mobilize anterior shift to neutral alignment , test MMT, ankle stabilizers    Precious Bard, PT 07/03/2023, 10:34 AM

## 2023-07-03 ENCOUNTER — Ambulatory Visit

## 2023-07-03 DIAGNOSIS — M25671 Stiffness of right ankle, not elsewhere classified: Secondary | ICD-10-CM

## 2023-07-03 DIAGNOSIS — R262 Difficulty in walking, not elsewhere classified: Secondary | ICD-10-CM

## 2023-07-03 DIAGNOSIS — M25571 Pain in right ankle and joints of right foot: Secondary | ICD-10-CM

## 2023-07-10 NOTE — Therapy (Signed)
 OUTPATIENT PHYSICAL THERAPY LOWER EXTREMITY TREATMENT   Patient Name: Victoria Berry MRN: [id number] DOB:1970-10-20, 53 y.o., female Today's Date: 07/11/2023  END OF SESSION:  PT End of Session - 07/11/23 1527     Visit Number 3    Number of Visits 16    Date for PT Re-Evaluation 08/21/23    PT Start Time 1530    PT Stop Time 1614    PT Time Calculation (min) 44 min    Activity Tolerance Patient tolerated treatment well;Patient limited by pain    Behavior During Therapy [facility] for tasks assessed/performed               Past Medical History:  Diagnosis Date   GERD (gastroesophageal reflux disease)    Hypertension    Obesity    RA (rheumatoid arthritis) (HCC)    Past Surgical History:  Procedure Laterality Date   CESAREAN SECTION  2000   twins   COLONOSCOPY WITH PROPOFOL N/A 06/24/2015   Procedure: COLONOSCOPY WITH PROPOFOL;  Surgeon: Elnita Maxwell, MD;  Location: [facility] ENDOSCOPY;  Service: Endoscopy;  Laterality: N/A;   ESOPHAGOGASTRODUODENOSCOPY (EGD) WITH PROPOFOL N/A 06/24/2015   Procedure: ESOPHAGOGASTRODUODENOSCOPY (EGD) WITH PROPOFOL;  Surgeon: Elnita Maxwell, MD;  Location: [facility] ENDOSCOPY;  Service: Endoscopy;  Laterality: N/A;   right ureter     TONSILLECTOMY     Patient Active Problem List   Diagnosis Date Noted   Kidney cyst, acquired 03/14/2022   Intermittent palpitations 06/03/2019   Pulsatile tinnitus, right ear 12/25/2017   Anxiety state 01/16/2017   Major depression, melancholic type 01/16/2017   Acquired equinus deformity of both feet 10/25/2016   Chronic pain of right ankle 04/25/2016   Morbid obesity with BMI of 45.0-49.9, adult (HCC) 12/21/2015   CRP elevated 07/20/2015   Encounter for long-term (current) use of high-risk medication 04/20/2015   Mild obesity 04/20/2015   Rheumatoid arthritis of multiple sites with negative rheumatoid factor (HCC) 04/20/2015   Macular degeneration, dry 02/12/2014   GERD (gastroesophageal  reflux disease) 11/27/2013    PCP: Althea Grimmer  REFERRING PROVIDER: Milderd Meager   REFERRING DIAG: Peroneal tendonitis   THERAPY DIAG:  Difficulty in walking, not elsewhere classified  Stiffness of right ankle, not elsewhere classified  Pain in right ankle and joints of right foot  Rationale for Evaluation and Treatment: Rehabilitation  ONSET DATE: 5 years ago   SUBJECTIVE:   SUBJECTIVE STATEMENT: Patient reports she is doing well, got rained on when coming into PT.   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 Physician requests additional checking of hip and LE ROM.  PMH includes stage 2 moderate rheumatoid arthritis, lymphedema, HTN, GERD.  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 Has flair ups. 5 years ago noticed R foot arch falling. Walk with in toe when plantar surface hurts.  Has good history of dry needling PAIN:  Are you having pain? Yes: NPRS scale: 9/10 Pain location: heel, plantar surfaces Pain description: stabbing  Aggravating factors: dressing Relieving factors: n/a, rest   Plantar surface: worst 9/10 Midfoot, arch and medial tendons: worst 7/10   PRECAUTIONS: None  RED FLAGS: None   WEIGHT BEARING RESTRICTIONS: No  FALLS:  Has patient fallen in last 6 months? No  LIVING ENVIRONMENT: Lives with: lives with their family Lives in: House/apartment Stairs: No   OCCUPATION: Armed forces operational officer   PLOF: Independent  PATIENT GOALS: to go to the gym  NEXT MD VISIT: n/a  OBJECTIVE:  Note: Objective measures were completed at Evaluation unless otherwise noted.  DIAGNOSTIC FINDINGS: n/a  PATIENT SURVEYS:  LEFS 55  COGNITION: Overall cognitive status: Within functional limits for tasks assessed    EDEMA:  Hx of active lymphedema management in bilateral LE  MUSCLE LENGTH: Limited calf length: see AROM resting position below  POSTURE:  weight shift left  PALPATION: Tenderness to plantar aspect of foot and dorsal region of talus   LOWER EXTREMITY ROM:  Active ROM Right eval Left eval  Hip flexion    Hip extension    Hip abduction    Hip adduction    Hip internal rotation    Hip external rotation    Knee flexion    Knee extension    Ankle dorsiflexion AROM: -12 PROM; -2 AROM 5 PROM 10  Ankle plantarflexion AROM: 5 degrees actively from neutral position AROM 30  Ankle inversion    Ankle eversion     (Blank rows = not tested)  Neutral position: 39 R foot   Trunk Flexion WFL  Trunk Extension WFL  Trunk R SB WFL  Trunk L SB WFL  Trunk R rotation WFL  Trunk L rotation WFL    LOWER EXTREMITY MMT: Test Next Session   MMT Right eval Left eval  Hip flexion    Hip extension    Hip abduction    Hip adduction    Hip internal rotation    Hip external rotation    Knee flexion    Knee extension    Ankle dorsiflexion    Ankle plantarflexion    Ankle inversion    Ankle eversion     (Blank rows = not tested)  LOWER EXTREMITY SPECIAL TESTS:  Ankle special test:  Eversion stress test: negative  Anterior drawer test :positive Talar tilt test: positive  FUNCTIONAL TESTS:  10 meter walk test: 8 seconds with antalgic gait pattern  GAIT: Distance walked: 40 ft Assistive device utilized: None Level of assistance: SBA and CGA Comments: severe antalgic gait pattern with significant weight shift to LLE.                                                                                                                                 TREATMENT DATE: 07/11/23 Manual:  Iastm to plantar surface of foot and achilles x 15 minutes with multiple areas of adhesions. Calcaneal distraction 3x30 seconds calcaneal medial/lateral mobilization: multiple repetitions Talocrural AP: x multiple repetitions Df with AP mobilization x multiple repetitions with foot on PT leg   TherEx Prostretch df/pf 15x Baps board df/pf  15x   Trigger Point Dry Needling  Initial Treatment: Pt instructed on Dry Needling rational, procedures, and possible side effects. Pt instructed to expect mild to moderate muscle soreness later in the day and/or into the next day.  Pt instructed in methods to reduce muscle soreness. Pt instructed to continue prescribed HEP. Patient was  educated on signs and symptoms of infection and other risk factors and advised to seek medical attention should they occur.  Patient verbalized understanding of these instructions and education.   Patient Verbal Consent Given: Yes Education Handout Provided: Yes Muscles Treated: anterior calf: tibialis anterior, extensor digitorum longus Electrical Stimulation Performed: No Treatment Response/Outcome: good response, no adverse reactions     PATIENT EDUCATION:  Education details: goals, POC, HEP  Person educated: Patient Education method: Explanation, Demonstration, Tactile cues, Verbal cues, and Handouts Education comprehension: verbalized understanding, returned demonstration, verbal cues required, tactile cues required, and needs further education  HOME EXERCISE PROGRAM: Access Code: [email or web address] URL: [email or web address] Date: 06/26/2023 Prepared by: Precious Bard  Exercises - Seated Great Toe Extension  - 1 x daily - 7 x weekly - 2 sets - 10 reps - 5 hold - Seated Calf Stretch with Strap  - 1 x daily - 7 x weekly - 2 sets - 2 reps - 30 hold - Seated Ankle Alphabet  - 1 x daily - 7 x weekly - 2 sets - 10 reps - 5 hold  ASSESSMENT:  CLINICAL IMPRESSION: Patient is highly  motivated throughout session. Decreased pain reported by end of session. Will benefit from calf lengthening next session. Patient initially flared at start of session due to coming from work.  Patient will benefit from skilled physical therapy to reduce pain, improve functional mobility, and return to PLOF.   OBJECTIVE IMPAIRMENTS: Abnormal gait, decreased  activity tolerance, decreased coordination, decreased endurance, decreased mobility, difficulty walking, decreased ROM, decreased strength, hypomobility, increased edema, increased fascial restrictions, impaired perceived functional ability, increased muscle spasms, impaired flexibility, improper body mechanics, postural dysfunction, and pain.   ACTIVITY LIMITATIONS: carrying, lifting, sitting, standing, squatting, stairs, transfers, hygiene/grooming, locomotion level, and caring for others  PARTICIPATION LIMITATIONS: meal prep, cleaning, laundry, driving, shopping, community activity, occupation, and yard work  PERSONAL FACTORS: Age, Behavior pattern, Past/current experiences, Profession, Time since onset of injury/illness/exacerbation, and 3+ comorbidities: stage 2 moderate rheumatoid arthritis, lymphedema, HTN, GERD  are also affecting patient's functional outcome.   REHAB POTENTIAL: Good  CLINICAL DECISION MAKING: Evolving/moderate complexity  EVALUATION COMPLEXITY: Moderate   GOALS: Goals reviewed with patient? Yes  SHORT TERM GOALS: Target date: 07/24/2023   Patient will be independent in home exercise program to improve strength/mobility for better functional independence with ADLs. Baseline: Goal status: INITIAL    LONG TERM GOALS: Target date: 08/21/2023   Patient will report a worst pain of 3/10 on VAS in plantar aspect of foot to improve tolerance with ADLs and reduced symptoms with activities.  Baseline: 3/5: 9/10  Goal status: INITIAL  2.  Patient will achieve >10 degrees of DF to improve foot clearance and positioning.  Baseline: -12 AROM  Goal status: INITIAL  3.  Patient will return to gym program with no limitations to return to PLOF. Baseline:  3/5: unable to go to gym  Goal status: INITIAL  4.  Patient will increase lower extremity functional scale to >70/80 to demonstrate improved functional mobility and increased tolerance with ADLs.  Baseline:  3/5: 55/80   Goal status: INITIAL     PLAN:  PT FREQUENCY: 2x/week  PT DURATION: 8 weeks  PLANNED INTERVENTIONS: 97164- PT Re-evaluation, 97110-Therapeutic exercises, 97530- Therapeutic activity, [id number]- Neuromuscular re-education, 97535- Self Care, 72536- Manual therapy, [id number]- Gait training, [phone]- Orthotic Fit/training, [phone]- Canalith repositioning, [id number]- Splinting, [id number]- Electrical stimulation (unattended), [id number]- Electrical stimulation (manual), [id number]- Vasopneumatic device, [id number]- Ultrasound, [id number]- Traction (mechanical), Patient/Family education, Balance training, Stair  training, Taping, Dry Needling, Joint mobilization, Joint manipulation, Spinal mobilization, Compression bandaging, Vestibular training, Visual/preceptual remediation/compensation, DME instructions, Cryotherapy, and Moist heat  PLAN FOR NEXT SESSION: needle anterior calf, iastm to plantar surface of foot, mobilize anterior shift to neutral alignment , test MMT, ankle stabilizers    Precious Bard, PT 07/11/2023, 5:16 PM

## 2023-07-11 ENCOUNTER — Ambulatory Visit

## 2023-07-11 DIAGNOSIS — M25671 Stiffness of right ankle, not elsewhere classified: Secondary | ICD-10-CM

## 2023-07-11 DIAGNOSIS — M25571 Pain in right ankle and joints of right foot: Secondary | ICD-10-CM | POA: Diagnosis not present

## 2023-07-11 DIAGNOSIS — R262 Difficulty in walking, not elsewhere classified: Secondary | ICD-10-CM

## 2023-07-16 NOTE — Therapy (Signed)
 OUTPATIENT PHYSICAL THERAPY LOWER EXTREMITY TREATMENT   Patient Name: Terree Gaultney MRN: [id number] DOB:05-04-1970, 53 y.o., female Today's Date: 07/17/2023  END OF SESSION:  PT End of Session - 07/17/23 0716     Visit Number 4    Number of Visits 16    Date for PT Re-Evaluation 08/21/23    PT Start Time 0715    PT Stop Time 0758    PT Time Calculation (min) 43 min    Activity Tolerance Patient tolerated treatment well;Patient limited by pain    Behavior During Therapy [facility] for tasks assessed/performed                Past Medical History:  Diagnosis Date   GERD (gastroesophageal reflux disease)    Hypertension    Obesity    RA (rheumatoid arthritis) (HCC)    Past Surgical History:  Procedure Laterality Date   CESAREAN SECTION  2000   twins   COLONOSCOPY WITH PROPOFOL N/A 06/24/2015   Procedure: COLONOSCOPY WITH PROPOFOL;  Surgeon: Elnita Maxwell, MD;  Location: [facility] ENDOSCOPY;  Service: Endoscopy;  Laterality: N/A;   ESOPHAGOGASTRODUODENOSCOPY (EGD) WITH PROPOFOL N/A 06/24/2015   Procedure: ESOPHAGOGASTRODUODENOSCOPY (EGD) WITH PROPOFOL;  Surgeon: Elnita Maxwell, MD;  Location: [facility] ENDOSCOPY;  Service: Endoscopy;  Laterality: N/A;   right ureter     TONSILLECTOMY     Patient Active Problem List   Diagnosis Date Noted   Kidney cyst, acquired 03/14/2022   Intermittent palpitations 06/03/2019   Pulsatile tinnitus, right ear 12/25/2017   Anxiety state 01/16/2017   Major depression, melancholic type 01/16/2017   Acquired equinus deformity of both feet 10/25/2016   Chronic pain of right ankle 04/25/2016   Morbid obesity with BMI of 45.0-49.9, adult (HCC) 12/21/2015   CRP elevated 07/20/2015   Encounter for long-term (current) use of high-risk medication 04/20/2015   Mild obesity 04/20/2015   Rheumatoid arthritis of multiple sites with negative rheumatoid factor (HCC) 04/20/2015   Macular degeneration, dry 02/12/2014   GERD (gastroesophageal  reflux disease) 11/27/2013    PCP: Althea Grimmer  REFERRING PROVIDER: Milderd Meager   REFERRING DIAG: Peroneal tendonitis   THERAPY DIAG:  Difficulty in walking, not elsewhere classified  Stiffness of right ankle, not elsewhere classified  Pain in right ankle and joints of right foot  Rationale for Evaluation and Treatment: Rehabilitation  ONSET DATE: 5 years ago   SUBJECTIVE:   SUBJECTIVE STATEMENT: Patient reports her pain is improving, is a 5/10  PERTINENT HISTORY: Patient referred for 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 Physician requests additional checking of hip and LE ROM.  PMH includes stage 2 moderate rheumatoid arthritis, lymphedema, HTN, GERD.  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 Has flair ups. 5 years ago noticed R foot arch falling. Walk with in toe when plantar surface hurts.  Has good history of dry needling PAIN:  Are you having pain? Yes: NPRS scale: 9/10 Pain location: heel, plantar surfaces Pain description: stabbing  Aggravating factors: dressing Relieving factors: n/a, rest   Plantar surface: worst 9/10 Midfoot, arch and medial tendons: worst 7/10   PRECAUTIONS: None  RED FLAGS: None   WEIGHT BEARING RESTRICTIONS: No  FALLS:  Has patient fallen in last 6 months? No  LIVING ENVIRONMENT: Lives with: lives with their family Lives in: House/apartment Stairs: No   OCCUPATION: Armed forces operational officer   PLOF: Independent  PATIENT GOALS: to go to the gym   NEXT MD VISIT:  n/a  OBJECTIVE:  Note: Objective measures were completed at Evaluation unless otherwise noted.  DIAGNOSTIC FINDINGS: n/a  PATIENT SURVEYS:  LEFS 55  COGNITION: Overall cognitive status: Within functional limits for tasks assessed    EDEMA:  Hx of active lymphedema management in bilateral LE  MUSCLE LENGTH: Limited calf length: see AROM resting position below  POSTURE: weight shift  left  PALPATION: Tenderness to plantar aspect of foot and dorsal region of talus   LOWER EXTREMITY ROM:  Active ROM Right eval Left eval  Hip flexion    Hip extension    Hip abduction    Hip adduction    Hip internal rotation    Hip external rotation    Knee flexion    Knee extension    Ankle dorsiflexion AROM: -12 PROM; -2 AROM 5 PROM 10  Ankle plantarflexion AROM: 5 degrees actively from neutral position AROM 30  Ankle inversion    Ankle eversion     (Blank rows = not tested)  Neutral position: 39 R foot   Trunk Flexion WFL  Trunk Extension WFL  Trunk R SB WFL  Trunk L SB WFL  Trunk R rotation WFL  Trunk L rotation WFL    LOWER EXTREMITY MMT: Test Next Session   MMT Right eval Left eval  Hip flexion    Hip extension    Hip abduction    Hip adduction    Hip internal rotation    Hip external rotation    Knee flexion    Knee extension    Ankle dorsiflexion    Ankle plantarflexion    Ankle inversion    Ankle eversion     (Blank rows = not tested)  LOWER EXTREMITY SPECIAL TESTS:  Ankle special test:  Eversion stress test: negative  Anterior drawer test :positive Talar tilt test: positive  FUNCTIONAL TESTS:  10 meter walk test: 8 seconds with antalgic gait pattern  GAIT: Distance walked: 40 ft Assistive device utilized: None Level of assistance: SBA and CGA Comments: severe antalgic gait pattern with significant weight shift to LLE.                                                                                                                                 TREATMENT DATE: 07/17/23 Manual:  Iastm to plantar surface of foot and achilles x 15 minutes with multiple areas of adhesions. Calcaneal distraction 3x30 seconds calcaneal medial/lateral mobilization: multiple repetitions Talocrural AP: x multiple repetitions Df with AP mobilization x multiple repetitions with foot on PT leg   TherEx Dynadisc: df/pf 10x, eversion inversion 10x,  clockwise 10x, counterclockwise 10x Baps board df/pf 15x Posterior pelvic tilt 10x 3 second holds   Trigger Point Dry Needling  Initial Treatment: Pt instructed on Dry Needling rational, procedures, and possible side effects. Pt instructed to expect mild to moderate muscle soreness later in the day and/or into the next day.  Pt instructed in methods to  reduce muscle soreness. Pt instructed to continue prescribed HEP. Patient was educated on signs and symptoms of infection and other risk factors and advised to seek medical attention should they occur.  Patient verbalized understanding of these instructions and education.   Patient Verbal Consent Given: Yes Education Handout Provided: Yes Muscles Treated: anterior calf: tibialis anterior, extensor digitorum longus Electrical Stimulation Performed: No Treatment Response/Outcome: good response, no adverse reactions     PATIENT EDUCATION:  Education details: goals, POC, HEP  Person educated: Patient Education method: Explanation, Demonstration, Tactile cues, Verbal cues, and Handouts Education comprehension: verbalized understanding, returned demonstration, verbal cues required, tactile cues required, and needs further education  HOME EXERCISE PROGRAM: Access Code: [email or web address] URL: [email or web address] Date: 06/26/2023 Prepared by: Precious Bard  Exercises - Seated Great Toe Extension  - 1 x daily - 7 x weekly - 2 sets - 10 reps - 5 hold - Seated Calf Stretch with Strap  - 1 x daily - 7 x weekly - 2 sets - 2 reps - 30 hold - Seated Ankle Alphabet  - 1 x daily - 7 x weekly - 2 sets - 10 reps - 5 hold  ASSESSMENT:  CLINICAL IMPRESSION: Patient regressed from Baps board to dynadisc for increased ankle control and strengthening of musculature. She demonstrates improved controlled and ROM with use of dynadisc.  Patient will benefit from skilled physical therapy to reduce pain, improve functional mobility, and return to PLOF.    OBJECTIVE IMPAIRMENTS: Abnormal gait, decreased activity tolerance, decreased coordination, decreased endurance, decreased mobility, difficulty walking, decreased ROM, decreased strength, hypomobility, increased edema, increased fascial restrictions, impaired perceived functional ability, increased muscle spasms, impaired flexibility, improper body mechanics, postural dysfunction, and pain.   ACTIVITY LIMITATIONS: carrying, lifting, sitting, standing, squatting, stairs, transfers, hygiene/grooming, locomotion level, and caring for others  PARTICIPATION LIMITATIONS: meal prep, cleaning, laundry, driving, shopping, community activity, occupation, and yard work  PERSONAL FACTORS: Age, Behavior pattern, Past/current experiences, Profession, Time since onset of injury/illness/exacerbation, and 3+ comorbidities: stage 2 moderate rheumatoid arthritis, lymphedema, HTN, GERD  are also affecting patient's functional outcome.   REHAB POTENTIAL: Good  CLINICAL DECISION MAKING: Evolving/moderate complexity  EVALUATION COMPLEXITY: Moderate   GOALS: Goals reviewed with patient? Yes  SHORT TERM GOALS: Target date: 07/24/2023   Patient will be independent in home exercise program to improve strength/mobility for better functional independence with ADLs. Baseline: Goal status: INITIAL    LONG TERM GOALS: Target date: 08/21/2023   Patient will report a worst pain of 3/10 on VAS in plantar aspect of foot to improve tolerance with ADLs and reduced symptoms with activities.  Baseline: 3/5: 9/10  Goal status: INITIAL  2.  Patient will achieve >10 degrees of DF to improve foot clearance and positioning.  Baseline: -12 AROM  Goal status: INITIAL  3.  Patient will return to gym program with no limitations to return to PLOF. Baseline:  3/5: unable to go to gym  Goal status: INITIAL  4.  Patient will increase lower extremity functional scale to >70/80 to demonstrate improved functional mobility and  increased tolerance with ADLs.  Baseline:  3/5: 55/80  Goal status: INITIAL     PLAN:  PT FREQUENCY: 2x/week  PT DURATION: 8 weeks  PLANNED INTERVENTIONS: 97164- PT Re-evaluation, 97110-Therapeutic exercises, 97530- Therapeutic activity, [id number]- Neuromuscular re-education, 97535- Self Care, [id number]- Manual therapy, [id number]- Gait training, [phone]- Orthotic Fit/training, [phone]- Canalith repositioning, [id number]- Splinting, Q4696- Electrical stimulation (unattended), [id number]- Electrical stimulation (manual), [id number]- Vasopneumatic device, [id number]- Ultrasound, [id number]- Traction (mechanical), Patient/Family education,  Balance training, Stair training, Taping, Dry Needling, Joint mobilization, Joint manipulation, Spinal mobilization, Compression bandaging, Vestibular training, Visual/preceptual remediation/compensation, DME instructions, Cryotherapy, and Moist heat  PLAN FOR NEXT SESSION: needle anterior calf, iastm to plantar surface of foot, mobilize anterior shift to neutral alignment , test MMT, ankle stabilizers    Precious Bard, PT 07/17/2023, 9:19 AM

## 2023-07-17 ENCOUNTER — Ambulatory Visit

## 2023-07-17 DIAGNOSIS — M25571 Pain in right ankle and joints of right foot: Secondary | ICD-10-CM | POA: Diagnosis not present

## 2023-07-17 DIAGNOSIS — R262 Difficulty in walking, not elsewhere classified: Secondary | ICD-10-CM

## 2023-07-17 DIAGNOSIS — M25671 Stiffness of right ankle, not elsewhere classified: Secondary | ICD-10-CM

## 2023-07-22 ENCOUNTER — Ambulatory Visit

## 2023-07-22 DIAGNOSIS — M25571 Pain in right ankle and joints of right foot: Secondary | ICD-10-CM | POA: Diagnosis not present

## 2023-07-22 DIAGNOSIS — M25671 Stiffness of right ankle, not elsewhere classified: Secondary | ICD-10-CM

## 2023-07-22 DIAGNOSIS — R262 Difficulty in walking, not elsewhere classified: Secondary | ICD-10-CM

## 2023-07-22 NOTE — Therapy (Signed)
 OUTPATIENT PHYSICAL THERAPY LOWER EXTREMITY TREATMENT   Patient Name: Victoria Berry MRN: [id number] DOB:Jun 16, 1970, 53 y.o., female Today's Date: 07/22/2023  END OF SESSION:  PT End of Session - 07/22/23 1533     Visit Number 5    Number of Visits 16    Date for PT Re-Evaluation 08/21/23    PT Start Time 1402    PT Stop Time 1443    PT Time Calculation (min) 41 min    Activity Tolerance Patient tolerated treatment well;Patient limited by pain    Behavior During Therapy [facility] for tasks assessed/performed               Past Medical History:  Diagnosis Date   GERD (gastroesophageal reflux disease)    Hypertension    Obesity    RA (rheumatoid arthritis) (HCC)    Past Surgical History:  Procedure Laterality Date   CESAREAN SECTION  2000   twins   COLONOSCOPY WITH PROPOFOL N/A 06/24/2015   Procedure: COLONOSCOPY WITH PROPOFOL;  Surgeon: Elnita Maxwell, MD;  Location: [facility] ENDOSCOPY;  Service: Endoscopy;  Laterality: N/A;   ESOPHAGOGASTRODUODENOSCOPY (EGD) WITH PROPOFOL N/A 06/24/2015   Procedure: ESOPHAGOGASTRODUODENOSCOPY (EGD) WITH PROPOFOL;  Surgeon: Elnita Maxwell, MD;  Location: [facility] ENDOSCOPY;  Service: Endoscopy;  Laterality: N/A;   right ureter     TONSILLECTOMY     Patient Active Problem List   Diagnosis Date Noted   Kidney cyst, acquired 03/14/2022   Intermittent palpitations 06/03/2019   Pulsatile tinnitus, right ear 12/25/2017   Anxiety state 01/16/2017   Major depression, melancholic type 01/16/2017   Acquired equinus deformity of both feet 10/25/2016   Chronic pain of right ankle 04/25/2016   Morbid obesity with BMI of 45.0-49.9, adult (HCC) 12/21/2015   CRP elevated 07/20/2015   Encounter for long-term (current) use of high-risk medication 04/20/2015   Mild obesity 04/20/2015   Rheumatoid arthritis of multiple sites with negative rheumatoid factor (HCC) 04/20/2015   Macular degeneration, dry 02/12/2014   GERD (gastroesophageal  reflux disease) 11/27/2013    PCP: Althea Grimmer  REFERRING PROVIDER: Milderd Meager   REFERRING DIAG: Peroneal tendonitis   THERAPY DIAG:  Difficulty in walking, not elsewhere classified  Stiffness of right ankle, not elsewhere classified  Pain in right ankle and joints of right foot  Rationale for Evaluation and Treatment: Rehabilitation  ONSET DATE: 5 years ago   SUBJECTIVE:   SUBJECTIVE STATEMENT:  Pt reports she's having 4/10 pain and notes that she is continuing to have the discomfort in the calf when performing ROM of the great toe.   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 Physician requests additional checking of hip and LE ROM.  PMH includes stage 2 moderate rheumatoid arthritis, lymphedema, HTN, GERD.  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 Has flair ups. 5 years ago noticed R foot arch falling. Walk with in toe when plantar surface hurts.  Has good history of dry needling   PAIN:  Are you having pain? Yes: NPRS scale: 9/10 Pain location: heel, plantar surfaces Pain description: stabbing  Aggravating factors: dressing Relieving factors: n/a, rest   Plantar surface: worst 9/10 Midfoot, arch and medial tendons: worst 7/10   PRECAUTIONS: None  RED FLAGS: None   WEIGHT BEARING RESTRICTIONS: No  FALLS:  Has patient fallen in last 6 months? No  LIVING ENVIRONMENT: Lives with: lives with their family Lives in: House/apartment Stairs: No  OCCUPATION: dental hygienist   PLOF: Independent  PATIENT GOALS: to go to the gym   NEXT MD VISIT: n/a  OBJECTIVE:  Note: Objective measures were completed at Evaluation unless otherwise noted.  DIAGNOSTIC FINDINGS: n/a  PATIENT SURVEYS:  LEFS 55  COGNITION: Overall cognitive status: Within functional limits for tasks assessed    EDEMA:  Hx of active lymphedema management in bilateral LE  MUSCLE  LENGTH: Limited calf length: see AROM resting position below  POSTURE: weight shift left  PALPATION: Tenderness to plantar aspect of foot and dorsal region of talus   LOWER EXTREMITY ROM:  Active ROM Right eval Left eval  Hip flexion    Hip extension    Hip abduction    Hip adduction    Hip internal rotation    Hip external rotation    Knee flexion    Knee extension    Ankle dorsiflexion AROM: -12 PROM; -2 AROM 5 PROM 10  Ankle plantarflexion AROM: 5 degrees actively from neutral position AROM 30  Ankle inversion    Ankle eversion     (Blank rows = not tested)  Neutral position: 39 R foot   Trunk Flexion WFL  Trunk Extension WFL  Trunk R SB WFL  Trunk L SB WFL  Trunk R rotation WFL  Trunk L rotation WFL    LOWER EXTREMITY MMT: Test Next Session   MMT Right eval Left eval  Hip flexion    Hip extension    Hip abduction    Hip adduction    Hip internal rotation    Hip external rotation    Knee flexion    Knee extension    Ankle dorsiflexion    Ankle plantarflexion    Ankle inversion    Ankle eversion     (Blank rows = not tested)  LOWER EXTREMITY SPECIAL TESTS:  Ankle special test:  Eversion stress test: negative  Anterior drawer test :positive Talar tilt test: positive  FUNCTIONAL TESTS:  10 meter walk test: 8 seconds with antalgic gait pattern  GAIT: Distance walked: 40 ft Assistive device utilized: None Level of assistance: SBA and CGA Comments: severe antalgic gait pattern with significant weight shift to LLE.                                                                                                                                 TREATMENT DATE: 07/22/23   Manual:  STM to plantar surface of foot and achilles x 15 minutes with multiple areas of adhesions. Long sitting calcaneal distraction 3x30 seconds Long sitting calcaneal medial/lateral mobilization: multiple repetitions Talocrural AP: x multiple repetitions, 30 sec  bouts Fan metatarsal mobilizations, Grades II-III, for improved ROM of the intrinsic foot musculature, 30 sec bouts spent each segment   Trigger Point Dry Needling  Initial Treatment: Pt instructed on Dry Needling rational, procedures, and possible side effects. Pt instructed to expect mild to moderate muscle soreness later in the  day and/or into the next day.  Pt instructed in methods to reduce muscle soreness. Pt instructed to continue prescribed HEP. Patient was educated on signs and symptoms of infection and other risk factors and advised to seek medical attention should they occur.  Patient verbalized understanding of these instructions and education.   Patient Verbal Consent Given: Yes Education Handout Provided: Yes Muscles Treated: anterior calf: tibialis anterior,  Electrical Stimulation Performed: No Treatment Response/Outcome: good response, one episode of nerve/electrical response when needling the anterior tib, but was able to redirect the needle and prevent the sensation from occurring again.     PATIENT EDUCATION:  Education details: goals, POC, HEP  Person educated: Patient Education method: Explanation, Demonstration, Tactile cues, Verbal cues, and Handouts Education comprehension: verbalized understanding, returned demonstration, verbal cues required, tactile cues required, and needs further education  HOME EXERCISE PROGRAM: Access Code: [email or web address] URL: [email or web address] Date: 06/26/2023 Prepared by: Precious Bard  Exercises - Seated Great Toe Extension  - 1 x daily - 7 x weekly - 2 sets - 10 reps - 5 hold - Seated Calf Stretch with Strap  - 1 x daily - 7 x weekly - 2 sets - 2 reps - 30 hold - Seated Ankle Alphabet  - 1 x daily - 7 x weekly - 2 sets - 10 reps - 5 hold  ASSESSMENT:  CLINICAL IMPRESSION:  Pt responded well to the manual therapy, noting 2-3 cavitations with talocrural and calcaneal mobilizations.  Pt noted to have relief after  the cavitations and no adverse reactions.  Pt also responded well to the dry needling, noting a reduction in tenderness of the regions following the technique.  Pt encouraged to continue to perform HEP in order to keep ROM and improve tolerance to exercises.   Pt will continue to benefit from skilled therapy to address remaining deficits in order to improve overall QoL and return to PLOF.       OBJECTIVE IMPAIRMENTS: Abnormal gait, decreased activity tolerance, decreased coordination, decreased endurance, decreased mobility, difficulty walking, decreased ROM, decreased strength, hypomobility, increased edema, increased fascial restrictions, impaired perceived functional ability, increased muscle spasms, impaired flexibility, improper body mechanics, postural dysfunction, and pain.   ACTIVITY LIMITATIONS: carrying, lifting, sitting, standing, squatting, stairs, transfers, hygiene/grooming, locomotion level, and caring for others  PARTICIPATION LIMITATIONS: meal prep, cleaning, laundry, driving, shopping, community activity, occupation, and yard work  PERSONAL FACTORS: Age, Behavior pattern, Past/current experiences, Profession, Time since onset of injury/illness/exacerbation, and 3+ comorbidities: stage 2 moderate rheumatoid arthritis, lymphedema, HTN, GERD  are also affecting patient's functional outcome.   REHAB POTENTIAL: Good  CLINICAL DECISION MAKING: Evolving/moderate complexity  EVALUATION COMPLEXITY: Moderate   GOALS: Goals reviewed with patient? Yes  SHORT TERM GOALS: Target date: 07/24/2023  Patient will be independent in home exercise program to improve strength/mobility for better functional independence with ADLs. Baseline: Goal status: INITIAL    LONG TERM GOALS: Target date: 08/21/2023  Patient will report a worst pain of 3/10 on VAS in plantar aspect of foot to improve tolerance with ADLs and reduced symptoms with activities.  Baseline: 3/5: 9/10  Goal status:  INITIAL  2.  Patient will achieve >10 degrees of DF to improve foot clearance and positioning.  Baseline: -12 AROM  Goal status: INITIAL  3.  Patient will return to gym program with no limitations to return to PLOF. Baseline:  3/5: unable to go to gym  Goal status: INITIAL  4.  Patient will increase lower extremity functional scale to >70/80 to  demonstrate improved functional mobility and increased tolerance with ADLs.  Baseline:  3/5: 55/80  Goal status: INITIAL     PLAN:  PT FREQUENCY: 2x/week  PT DURATION: 8 weeks  PLANNED INTERVENTIONS: 97164- PT Re-evaluation, 97110-Therapeutic exercises, 97530- Therapeutic activity, 97112- Neuromuscular re-education, 97535- Self Care, 91478- Manual therapy, [phone]- Gait training, [phone]- Orthotic Fit/training, [phone]- Canalith repositioning, [id number]- Splinting, N6295- Electrical stimulation (unattended), [phone]- Electrical stimulation (manual), 97016- Vasopneumatic device, [id number]- Ultrasound, 24401- Traction (mechanical), Patient/Family education, Balance training, Stair training, Taping, Dry Needling, Joint mobilization, Joint manipulation, Spinal mobilization, Compression bandaging, Vestibular training, Visual/preceptual remediation/compensation, DME instructions, Cryotherapy, and Moist heat  PLAN FOR NEXT SESSION:  needle anterior calf, iastm to plantar surface of foot, mobilize anterior shift to neutral alignment, test MMT, ankle stabilizers    Nolon Bussing, PT, DPT Physical Therapist - [facility]  [facility]  07/22/23, 3:40 PM

## 2023-07-23 NOTE — Therapy (Signed)
 OUTPATIENT PHYSICAL THERAPY LOWER EXTREMITY TREATMENT   Patient Name: Victoria Berry MRN: [id number] DOB:Jul 25, 1970, 53 y.o., female Today's Date: 07/24/2023  END OF SESSION:  PT End of Session - 07/24/23 0716     Visit Number 6    Number of Visits 16    Date for PT Re-Evaluation 08/21/23    PT Start Time 0715    PT Stop Time 0759    PT Time Calculation (min) 44 min    Activity Tolerance Patient tolerated treatment well;Patient limited by pain    Behavior During Therapy [facility] for tasks assessed/performed                Past Medical History:  Diagnosis Date   GERD (gastroesophageal reflux disease)    Hypertension    Obesity    RA (rheumatoid arthritis) (HCC)    Past Surgical History:  Procedure Laterality Date   CESAREAN SECTION  2000   twins   COLONOSCOPY WITH PROPOFOL N/A 06/24/2015   Procedure: COLONOSCOPY WITH PROPOFOL;  Surgeon: Elnita Maxwell, MD;  Location: [facility] ENDOSCOPY;  Service: Endoscopy;  Laterality: N/A;   ESOPHAGOGASTRODUODENOSCOPY (EGD) WITH PROPOFOL N/A 06/24/2015   Procedure: ESOPHAGOGASTRODUODENOSCOPY (EGD) WITH PROPOFOL;  Surgeon: Elnita Maxwell, MD;  Location: [facility] ENDOSCOPY;  Service: Endoscopy;  Laterality: N/A;   right ureter     TONSILLECTOMY     Patient Active Problem List   Diagnosis Date Noted   Kidney cyst, acquired 03/14/2022   Intermittent palpitations 06/03/2019   Pulsatile tinnitus, right ear 12/25/2017   Anxiety state 01/16/2017   Major depression, melancholic type 01/16/2017   Acquired equinus deformity of both feet 10/25/2016   Chronic pain of right ankle 04/25/2016   Morbid obesity with BMI of 45.0-49.9, adult (HCC) 12/21/2015   CRP elevated 07/20/2015   Encounter for long-term (current) use of high-risk medication 04/20/2015   Mild obesity 04/20/2015   Rheumatoid arthritis of multiple sites with negative rheumatoid factor (HCC) 04/20/2015   Macular degeneration, dry 02/12/2014   GERD (gastroesophageal  reflux disease) 11/27/2013    PCP: Althea Grimmer  REFERRING PROVIDER: Milderd Meager   REFERRING DIAG: Peroneal tendonitis   THERAPY DIAG:  Difficulty in walking, not elsewhere classified  Stiffness of right ankle, not elsewhere classified  Pain in right ankle and joints of right foot  Rationale for Evaluation and Treatment: Rehabilitation  ONSET DATE: 5 years ago   SUBJECTIVE:   SUBJECTIVE STATEMENT:  Patient reports the pain is decreasing when getting out of a car. Still is limping.    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 Physician requests additional checking of hip and LE ROM.  PMH includes stage 2 moderate rheumatoid arthritis, lymphedema, HTN, GERD.  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 Has flair ups. 5 years ago noticed R foot arch falling. Walk with in toe when plantar surface hurts.  Has good history of dry needling   PAIN:  Are you having pain? Yes: NPRS scale: 9/10 Pain location: heel, plantar surfaces Pain description: stabbing  Aggravating factors: dressing Relieving factors: n/a, rest   Plantar surface: worst 9/10 Midfoot, arch and medial tendons: worst 7/10   PRECAUTIONS: None  RED FLAGS: None   WEIGHT BEARING RESTRICTIONS: No  FALLS:  Has patient fallen in last 6 months? No  LIVING ENVIRONMENT: Lives with: lives with their family Lives in: House/apartment Stairs: No   OCCUPATION: Armed forces operational officer   PLOF: Independent  PATIENT GOALS: to go to the gym   NEXT MD VISIT: n/a  OBJECTIVE:  Note: Objective measures were completed at Evaluation unless otherwise noted.  DIAGNOSTIC FINDINGS: n/a  PATIENT SURVEYS:  LEFS 55  COGNITION: Overall cognitive status: Within functional limits for tasks assessed    EDEMA:  Hx of active lymphedema management in bilateral LE  MUSCLE LENGTH: Limited calf length: see AROM resting  position below  POSTURE: weight shift left  PALPATION: Tenderness to plantar aspect of foot and dorsal region of talus   LOWER EXTREMITY ROM:  Active ROM Right eval Left eval  Hip flexion    Hip extension    Hip abduction    Hip adduction    Hip internal rotation    Hip external rotation    Knee flexion    Knee extension    Ankle dorsiflexion AROM: -12 PROM; -2 AROM 5 PROM 10  Ankle plantarflexion AROM: 5 degrees actively from neutral position AROM 30  Ankle inversion    Ankle eversion     (Blank rows = not tested)  Neutral position: 39 R foot   Trunk Flexion WFL  Trunk Extension WFL  Trunk R SB WFL  Trunk L SB WFL  Trunk R rotation WFL  Trunk L rotation WFL    LOWER EXTREMITY MMT: Test Next Session   MMT Right eval Left eval  Hip flexion    Hip extension    Hip abduction    Hip adduction    Hip internal rotation    Hip external rotation    Knee flexion    Knee extension    Ankle dorsiflexion    Ankle plantarflexion    Ankle inversion    Ankle eversion     (Blank rows = not tested)  LOWER EXTREMITY SPECIAL TESTS:  Ankle special test:  Eversion stress test: negative  Anterior drawer test :positive Talar tilt test: positive  FUNCTIONAL TESTS:  10 meter walk test: 8 seconds with antalgic gait pattern  GAIT: Distance walked: 40 ft Assistive device utilized: None Level of assistance: SBA and CGA Comments: severe antalgic gait pattern with significant weight shift to LLE.                                                                                                                                 TREATMENT DATE: 07/24/23   Manual:  STM to plantar surface of foot and achilles x 15 minutes with multiple areas of adhesions. Long sitting calcaneal distraction 3x30 seconds Long sitting calcaneal medial/lateral mobilization: multiple repetitions Talocrural AP: x multiple repetitions, 30 sec bouts Fan metatarsal mobilizations, Grades II-III, for  improved ROM of the intrinsic foot musculature, 30 sec bouts spent each segment K taping of R foot: fifth met lift arch lift 10x   TherEx:  Green dynadisc: Df/pf 15x each side, inversion/eversion 15x each side; clockwise 20x counterclockwise 20x each side      PATIENT EDUCATION:  Education  details: goals, POC, HEP  Person educated: Patient Education method: Explanation, Demonstration, Tactile cues, Verbal cues, and Handouts Education comprehension: verbalized understanding, returned demonstration, verbal cues required, tactile cues required, and needs further education  HOME EXERCISE PROGRAM: Access Code: [email or web address] URL: [email or web address] Date: 06/26/2023 Prepared by: Precious Bard  Exercises - Seated Great Toe Extension  - 1 x daily - 7 x weekly - 2 sets - 10 reps - 5 hold - Seated Calf Stretch with Strap  - 1 x daily - 7 x weekly - 2 sets - 2 reps - 30 hold - Seated Ankle Alphabet  - 1 x daily - 7 x weekly - 2 sets - 10 reps - 5 hold  ASSESSMENT:  CLINICAL IMPRESSION:  Patient has improved control of bilateral ankles with decreased instability and jerkiness. Increased cavitations noted with mobilizations with patient reporting decreased pain by end of session. K taping performed for arch.   Pt will continue to benefit from skilled therapy to address remaining deficits in order to improve overall QoL and return to PLOF.       OBJECTIVE IMPAIRMENTS: Abnormal gait, decreased activity tolerance, decreased coordination, decreased endurance, decreased mobility, difficulty walking, decreased ROM, decreased strength, hypomobility, increased edema, increased fascial restrictions, impaired perceived functional ability, increased muscle spasms, impaired flexibility, improper body mechanics, postural dysfunction, and pain.   ACTIVITY LIMITATIONS: carrying, lifting, sitting, standing, squatting, stairs, transfers, hygiene/grooming, locomotion level, and caring for  others  PARTICIPATION LIMITATIONS: meal prep, cleaning, laundry, driving, shopping, community activity, occupation, and yard work  PERSONAL FACTORS: Age, Behavior pattern, Past/current experiences, Profession, Time since onset of injury/illness/exacerbation, and 3+ comorbidities: stage 2 moderate rheumatoid arthritis, lymphedema, HTN, GERD  are also affecting patient's functional outcome.   REHAB POTENTIAL: Good  CLINICAL DECISION MAKING: Evolving/moderate complexity  EVALUATION COMPLEXITY: Moderate   GOALS: Goals reviewed with patient? Yes  SHORT TERM GOALS: Target date: 07/24/2023  Patient will be independent in home exercise program to improve strength/mobility for better functional independence with ADLs. Baseline: Goal status: INITIAL    LONG TERM GOALS: Target date: 08/21/2023  Patient will report a worst pain of 3/10 on VAS in plantar aspect of foot to improve tolerance with ADLs and reduced symptoms with activities.  Baseline: 3/5: 9/10  Goal status: INITIAL  2.  Patient will achieve >10 degrees of DF to improve foot clearance and positioning.  Baseline: -12 AROM  Goal status: INITIAL  3.  Patient will return to gym program with no limitations to return to PLOF. Baseline:  3/5: unable to go to gym  Goal status: INITIAL  4.  Patient will increase lower extremity functional scale to >70/80 to demonstrate improved functional mobility and increased tolerance with ADLs.  Baseline:  3/5: 55/80  Goal status: INITIAL     PLAN:  PT FREQUENCY: 2x/week  PT DURATION: 8 weeks  PLANNED INTERVENTIONS: 97164- PT Re-evaluation, 97110-Therapeutic exercises, 97530- Therapeutic activity, [id number]- Neuromuscular re-education, 97535- Self Care, 91478- Manual therapy, [id number]- Gait training, [phone]- Orthotic Fit/training, [phone]- Canalith repositioning, [id number]- Splinting, V7846- Electrical stimulation (unattended), [phone]- Electrical stimulation (manual), 97016- Vasopneumatic device, [id number]-  Ultrasound, 28413- Traction (mechanical), Patient/Family education, Balance training, Stair training, Taping, Dry Needling, Joint mobilization, Joint manipulation, Spinal mobilization, Compression bandaging, Vestibular training, Visual/preceptual remediation/compensation, DME instructions, Cryotherapy, and Moist heat  PLAN FOR NEXT SESSION:  needle anterior calf, iastm to plantar surface of foot, mobilize anterior shift to neutral alignment, test MMT, ankle stabilizers   Precious Bard, PT, DPT Physical Therapist - [facility]  Outpatient Physical Therapy- Main Campus [phone]    07/24/23, 8:01 AM

## 2023-07-24 ENCOUNTER — Ambulatory Visit

## 2023-07-24 DIAGNOSIS — M25571 Pain in right ankle and joints of right foot: Secondary | ICD-10-CM | POA: Insufficient documentation

## 2023-07-24 DIAGNOSIS — R262 Difficulty in walking, not elsewhere classified: Secondary | ICD-10-CM | POA: Insufficient documentation

## 2023-07-24 DIAGNOSIS — M25671 Stiffness of right ankle, not elsewhere classified: Secondary | ICD-10-CM | POA: Diagnosis present

## 2023-07-29 NOTE — Therapy (Signed)
 OUTPATIENT PHYSICAL THERAPY LOWER EXTREMITY TREATMENT   Patient Name: Victoria Berry MRN: [id number] DOB:11/18/1970, 53 y.o., female Today's Date: 07/30/2023  END OF SESSION:  PT End of Session - 07/30/23 0716     Visit Number 7    Number of Visits 16    Date for PT Re-Evaluation 08/21/23    PT Start Time 0715    PT Stop Time 0759    PT Time Calculation (min) 44 min    Activity Tolerance Patient tolerated treatment well;Patient limited by pain    Behavior During Therapy [facility] for tasks assessed/performed                 Past Medical History:  Diagnosis Date   GERD (gastroesophageal reflux disease)    Hypertension    Obesity    RA (rheumatoid arthritis) (HCC)    Past Surgical History:  Procedure Laterality Date   CESAREAN SECTION  2000   twins   COLONOSCOPY WITH PROPOFOL N/A 06/24/2015   Procedure: COLONOSCOPY WITH PROPOFOL;  Surgeon: Elnita Maxwell, MD;  Location: [facility] ENDOSCOPY;  Service: Endoscopy;  Laterality: N/A;   ESOPHAGOGASTRODUODENOSCOPY (EGD) WITH PROPOFOL N/A 06/24/2015   Procedure: ESOPHAGOGASTRODUODENOSCOPY (EGD) WITH PROPOFOL;  Surgeon: Elnita Maxwell, MD;  Location: [facility] ENDOSCOPY;  Service: Endoscopy;  Laterality: N/A;   right ureter     TONSILLECTOMY     Patient Active Problem List   Diagnosis Date Noted   Kidney cyst, acquired 03/14/2022   Intermittent palpitations 06/03/2019   Pulsatile tinnitus, right ear 12/25/2017   Anxiety state 01/16/2017   Major depression, melancholic type 01/16/2017   Acquired equinus deformity of both feet 10/25/2016   Chronic pain of right ankle 04/25/2016   Morbid obesity with BMI of 45.0-49.9, adult (HCC) 12/21/2015   CRP elevated 07/20/2015   Encounter for long-term (current) use of high-risk medication 04/20/2015   Mild obesity 04/20/2015   Rheumatoid arthritis of multiple sites with negative rheumatoid factor (HCC) 04/20/2015   Macular degeneration, dry 02/12/2014   GERD (gastroesophageal  reflux disease) 11/27/2013    PCP: Althea Grimmer  REFERRING PROVIDER: Milderd Meager   REFERRING DIAG: Peroneal tendonitis   THERAPY DIAG:  Difficulty in walking, not elsewhere classified  Stiffness of right ankle, not elsewhere classified  Pain in right ankle and joints of right foot  Rationale for Evaluation and Treatment: Rehabilitation  ONSET DATE: 5 years ago   SUBJECTIVE:   SUBJECTIVE STATEMENT:  Patient reports some discomfort with standing, has to go to work after PT today.    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 Physician requests additional checking of hip and LE ROM.  PMH includes stage 2 moderate rheumatoid arthritis, lymphedema, HTN, GERD.  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 Has flair ups. 5 years ago noticed R foot arch falling. Walk with in toe when plantar surface hurts.  Has good history of dry needling   PAIN:  Are you having pain? Yes: NPRS scale: 9/10 Pain location: heel, plantar surfaces Pain description: stabbing  Aggravating factors: dressing Relieving factors: n/a, rest   Plantar surface: worst 9/10 Midfoot, arch and medial tendons: worst 7/10   PRECAUTIONS: None  RED FLAGS: None   WEIGHT BEARING RESTRICTIONS: No  FALLS:  Has patient fallen in last 6 months? No  LIVING ENVIRONMENT: Lives with: lives with their family Lives in: House/apartment Stairs: No   OCCUPATION: Armed forces operational officer   PLOF: Independent  PATIENT GOALS: to go to the gym   NEXT MD VISIT: n/a  OBJECTIVE:  Note: Objective measures were completed at Evaluation unless otherwise noted.  DIAGNOSTIC FINDINGS: n/a  PATIENT SURVEYS:  LEFS 55  COGNITION: Overall cognitive status: Within functional limits for tasks assessed    EDEMA:  Hx of active lymphedema management in bilateral LE  MUSCLE LENGTH: Limited calf length: see AROM resting position  below  POSTURE: weight shift left  PALPATION: Tenderness to plantar aspect of foot and dorsal region of talus   LOWER EXTREMITY ROM:  Active ROM Right eval Left eval  Hip flexion    Hip extension    Hip abduction    Hip adduction    Hip internal rotation    Hip external rotation    Knee flexion    Knee extension    Ankle dorsiflexion AROM: -12 PROM; -2 AROM 5 PROM 10  Ankle plantarflexion AROM: 5 degrees actively from neutral position AROM 30  Ankle inversion    Ankle eversion     (Blank rows = not tested)  Neutral position: 39 R foot   Trunk Flexion WFL  Trunk Extension WFL  Trunk R SB WFL  Trunk L SB WFL  Trunk R rotation WFL  Trunk L rotation WFL    LOWER EXTREMITY MMT: Test Next Session   MMT Right eval Left eval  Hip flexion    Hip extension    Hip abduction    Hip adduction    Hip internal rotation    Hip external rotation    Knee flexion    Knee extension    Ankle dorsiflexion    Ankle plantarflexion    Ankle inversion    Ankle eversion     (Blank rows = not tested)  LOWER EXTREMITY SPECIAL TESTS:  Ankle special test:  Eversion stress test: negative  Anterior drawer test :positive Talar tilt test: positive  FUNCTIONAL TESTS:  10 meter walk test: 8 seconds with antalgic gait pattern  GAIT: Distance walked: 40 ft Assistive device utilized: None Level of assistance: SBA and CGA Comments: severe antalgic gait pattern with significant weight shift to LLE.                                                                                                                                 TREATMENT DATE: 07/30/23   Manual:  STM to plantar surface of foot and achilles x 15 minutes with multiple areas of adhesions. Long sitting calcaneal distraction 3x30 seconds Long sitting calcaneal medial/lateral mobilization: multiple repetitions Talocrural AP: x multiple repetitions, 30 sec bouts Fan metatarsal mobilizations, Grades II-III, for improved  ROM of the intrinsic foot musculature, 30 sec bouts spent each segment K taping of R foot: fifth met lift arch lift 10x   TherEx:  Green dynadisc: Df/pf 15x each side, inversion/eversion 15x each side; clockwise 20x counterclockwise 20x each side  Toe extension flat foot 10x  Trigger Point  Dry Needling  Subsequent Treatment: Instructions provided previously at initial dry needling treatment.   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 Treatment Response/Outcome: large trigger point     PATIENT EDUCATION:  Education details: goals, POC, HEP  Person educated: Patient Education method: Explanation, Demonstration, Tactile cues, Verbal cues, and Handouts Education comprehension: verbalized understanding, returned demonstration, verbal cues required, tactile cues required, and needs further education  HOME EXERCISE PROGRAM: Access Code: [email or web address] URL: [email or web address] Date: 06/26/2023 Prepared by: Precious Bard  Exercises - Seated Great Toe Extension  - 1 x daily - 7 x weekly - 2 sets - 10 reps - 5 hold - Seated Calf Stretch with Strap  - 1 x daily - 7 x weekly - 2 sets - 2 reps - 30 hold - Seated Ankle Alphabet  - 1 x daily - 7 x weekly - 2 sets - 10 reps - 5 hold  ASSESSMENT:  CLINICAL IMPRESSION: Patient has increased distal adhesions in plantar aspect of foot that are released with STM. Patient is highly motivated throughout session and has improved control by end of session with small ankle musculature contraction and spatial awareness .   Pt will continue to benefit from skilled therapy to address remaining deficits in order to improve overall QoL and return to PLOF.       OBJECTIVE IMPAIRMENTS: Abnormal gait, decreased activity tolerance, decreased coordination, decreased endurance, decreased mobility, difficulty walking, decreased ROM, decreased  strength, hypomobility, increased edema, increased fascial restrictions, impaired perceived functional ability, increased muscle spasms, impaired flexibility, improper body mechanics, postural dysfunction, and pain.   ACTIVITY LIMITATIONS: carrying, lifting, sitting, standing, squatting, stairs, transfers, hygiene/grooming, locomotion level, and caring for others  PARTICIPATION LIMITATIONS: meal prep, cleaning, laundry, driving, shopping, community activity, occupation, and yard work  PERSONAL FACTORS: Age, Behavior pattern, Past/current experiences, Profession, Time since onset of injury/illness/exacerbation, and 3+ comorbidities: stage 2 moderate rheumatoid arthritis, lymphedema, HTN, GERD  are also affecting patient's functional outcome.   REHAB POTENTIAL: Good  CLINICAL DECISION MAKING: Evolving/moderate complexity  EVALUATION COMPLEXITY: Moderate   GOALS: Goals reviewed with patient? Yes  SHORT TERM GOALS: Target date: 07/24/2023  Patient will be independent in home exercise program to improve strength/mobility for better functional independence with ADLs. Baseline: Goal status: INITIAL    LONG TERM GOALS: Target date: 08/21/2023  Patient will report a worst pain of 3/10 on VAS in plantar aspect of foot to improve tolerance with ADLs and reduced symptoms with activities.  Baseline: 3/5: 9/10  Goal status: INITIAL  2.  Patient will achieve >10 degrees of DF to improve foot clearance and positioning.  Baseline: -12 AROM  Goal status: INITIAL  3.  Patient will return to gym program with no limitations to return to PLOF. Baseline:  3/5: unable to go to gym  Goal status: INITIAL  4.  Patient will increase lower extremity functional scale to >70/80 to demonstrate improved functional mobility and increased tolerance with ADLs.  Baseline:  3/5: 55/80  Goal status: INITIAL     PLAN:  PT FREQUENCY: 2x/week  PT DURATION: 8 weeks  PLANNED INTERVENTIONS: 97164- PT  Re-evaluation, 97110-Therapeutic exercises, 97530- Therapeutic activity, [id number]- Neuromuscular re-education, 97535- Self Care, 16109- Manual therapy, [id number]- Gait training, [phone]- Orthotic Fit/training, [phone]- Canalith repositioning, [id number]- Splinting, B1478- Electrical stimulation (unattended), [id number]- Electrical stimulation (manual), [id number]- Vasopneumatic device, [id number]- Ultrasound, [id number]- Traction (mechanical), Patient/Family education, Balance training, Stair training, Taping, Dry Needling, Joint mobilization, Joint manipulation, Spinal  mobilization, Compression bandaging, Vestibular training, Visual/preceptual remediation/compensation, DME instructions, Cryotherapy, and Moist heat  PLAN FOR NEXT SESSION:  needle anterior calf, iastm to plantar surface of foot, mobilize anterior shift to neutral alignment, test MMT, ankle stabilizers   Precious Bard, PT, DPT Physical Therapist - [facility]  Outpatient Physical Therapy- Main Campus [phone]    07/30/23, 8:01 AM

## 2023-07-30 ENCOUNTER — Other Ambulatory Visit (INDEPENDENT_AMBULATORY_CARE_PROVIDER_SITE_OTHER): Payer: Self-pay | Admitting: Nurse Practitioner

## 2023-07-30 ENCOUNTER — Ambulatory Visit

## 2023-07-30 DIAGNOSIS — R599 Enlarged lymph nodes, unspecified: Secondary | ICD-10-CM

## 2023-07-30 DIAGNOSIS — M25571 Pain in right ankle and joints of right foot: Secondary | ICD-10-CM

## 2023-07-30 DIAGNOSIS — I890 Lymphedema, not elsewhere classified: Secondary | ICD-10-CM

## 2023-07-30 DIAGNOSIS — M25671 Stiffness of right ankle, not elsewhere classified: Secondary | ICD-10-CM

## 2023-07-30 DIAGNOSIS — R262 Difficulty in walking, not elsewhere classified: Secondary | ICD-10-CM

## 2023-07-31 ENCOUNTER — Ambulatory Visit (INDEPENDENT_AMBULATORY_CARE_PROVIDER_SITE_OTHER): Payer: 59 | Admitting: Nurse Practitioner

## 2023-07-31 ENCOUNTER — Encounter (INDEPENDENT_AMBULATORY_CARE_PROVIDER_SITE_OTHER): Payer: Self-pay

## 2023-07-31 ENCOUNTER — Encounter (INDEPENDENT_AMBULATORY_CARE_PROVIDER_SITE_OTHER): Payer: 59

## 2023-08-01 NOTE — Therapy (Signed)
 OUTPATIENT PHYSICAL THERAPY LOWER EXTREMITY TREATMENT   Patient Name: Victoria Berry MRN: [id number] DOB:04-Aug-1970, 53 y.o., female Today's Date: 08/05/2023  END OF SESSION:  PT End of Session - 08/05/23 1353     Visit Number 8    Number of Visits 16    Date for PT Re-Evaluation 08/21/23    PT Start Time 1400    PT Stop Time 1444    PT Time Calculation (min) 44 min    Activity Tolerance Patient tolerated treatment well;Patient limited by pain    Behavior During Therapy [facility] for tasks assessed/performed                  Past Medical History:  Diagnosis Date   GERD (gastroesophageal reflux disease)    Hypertension    Obesity    RA (rheumatoid arthritis) (HCC)    Past Surgical History:  Procedure Laterality Date   CESAREAN SECTION  2000   twins   COLONOSCOPY WITH PROPOFOL N/A 06/24/2015   Procedure: COLONOSCOPY WITH PROPOFOL;  Surgeon: Luella Sager, MD;  Location: [facility] ENDOSCOPY;  Service: Endoscopy;  Laterality: N/A;   ESOPHAGOGASTRODUODENOSCOPY (EGD) WITH PROPOFOL N/A 06/24/2015   Procedure: ESOPHAGOGASTRODUODENOSCOPY (EGD) WITH PROPOFOL;  Surgeon: Luella Sager, MD;  Location: [facility] ENDOSCOPY;  Service: Endoscopy;  Laterality: N/A;   right ureter     TONSILLECTOMY     Patient Active Problem List   Diagnosis Date Noted   Kidney cyst, acquired 03/14/2022   Intermittent palpitations 06/03/2019   Pulsatile tinnitus, right ear 12/25/2017   Anxiety state 01/16/2017   Major depression, melancholic type 01/16/2017   Acquired equinus deformity of both feet 10/25/2016   Chronic pain of right ankle 04/25/2016   Morbid obesity with BMI of 45.0-49.9, adult (HCC) 12/21/2015   CRP elevated 07/20/2015   Encounter for long-term (current) use of high-risk medication 04/20/2015   Mild obesity 04/20/2015   Rheumatoid arthritis of multiple sites with negative rheumatoid factor (HCC) 04/20/2015   Macular degeneration, dry 02/12/2014   GERD (gastroesophageal  reflux disease) 11/27/2013    PCP: Alyn Judge  REFERRING PROVIDER: Edward Graff   REFERRING DIAG: Peroneal tendonitis   THERAPY DIAG:  Difficulty in walking, not elsewhere classified  Stiffness of right ankle, not elsewhere classified  Pain in right ankle and joints of right foot  Rationale for Evaluation and Treatment: Rehabilitation  ONSET DATE: 5 years ago   SUBJECTIVE:   SUBJECTIVE STATEMENT:  Patient has new orthotics, from Thursday.    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 Physician requests additional checking of hip and LE ROM.  PMH includes stage 2 moderate rheumatoid arthritis, lymphedema, HTN, GERD.  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 Has flair ups. 5 years ago noticed R foot arch falling. Walk with in toe when plantar surface hurts.  Has good history of dry needling   PAIN:  Are you having pain? Yes: NPRS scale: 9/10 Pain location: heel, plantar surfaces Pain description: stabbing  Aggravating factors: dressing Relieving factors: n/a, rest   Plantar surface: worst 9/10 Midfoot, arch and medial tendons: worst 7/10   PRECAUTIONS: None  RED FLAGS: None   WEIGHT BEARING RESTRICTIONS: No  FALLS:  Has patient fallen in last 6 months? No  LIVING ENVIRONMENT: Lives with: lives with their family Lives in: House/apartment Stairs: No   OCCUPATION: Armed forces operational officer   PLOF: Independent  PATIENT GOALS: to go to the gym  NEXT MD VISIT: n/a  OBJECTIVE:  Note: Objective measures were completed at Evaluation unless otherwise noted.  DIAGNOSTIC FINDINGS: n/a  PATIENT SURVEYS:  LEFS 55  COGNITION: Overall cognitive status: Within functional limits for tasks assessed    EDEMA:  Hx of active lymphedema management in bilateral LE  MUSCLE LENGTH: Limited calf length: see AROM resting position below  POSTURE: weight shift  left  PALPATION: Tenderness to plantar aspect of foot and dorsal region of talus   LOWER EXTREMITY ROM:  Active ROM Right eval Left eval  Hip flexion    Hip extension    Hip abduction    Hip adduction    Hip internal rotation    Hip external rotation    Knee flexion    Knee extension    Ankle dorsiflexion AROM: -12 PROM; -2 AROM 5 PROM 10  Ankle plantarflexion AROM: 5 degrees actively from neutral position AROM 30  Ankle inversion    Ankle eversion     (Blank rows = not tested)  Neutral position: 39 R foot   Trunk Flexion WFL  Trunk Extension WFL  Trunk R SB WFL  Trunk L SB WFL  Trunk R rotation WFL  Trunk L rotation WFL    LOWER EXTREMITY MMT: Test Next Session   MMT Right eval Left eval  Hip flexion    Hip extension    Hip abduction    Hip adduction    Hip internal rotation    Hip external rotation    Knee flexion    Knee extension    Ankle dorsiflexion    Ankle plantarflexion    Ankle inversion    Ankle eversion     (Blank rows = not tested)  LOWER EXTREMITY SPECIAL TESTS:  Ankle special test:  Eversion stress test: negative  Anterior drawer test :positive Talar tilt test: positive  FUNCTIONAL TESTS:  10 meter walk test: 8 seconds with antalgic gait pattern  GAIT: Distance walked: 40 ft Assistive device utilized: None Level of assistance: SBA and CGA Comments: severe antalgic gait pattern with significant weight shift to LLE.                                                                                                                                 TREATMENT DATE: 08/05/23   Manual:  iaSTM to plantar surface of foot and achilles x 15 minutes with multiple areas of adhesions. STM to R anterior and posterior calf for tension reduction  Long sitting calcaneal distraction 3x30 seconds Long sitting calcaneal medial/lateral mobilization: multiple repetitions Talocrural AP: x multiple repetitions, 30 sec bouts Fan metatarsal  mobilizations, Grades II-III, for improved ROM of the intrinsic foot musculature, 30 sec bouts spent each segment   TherEx:  Green dynadisc: Df/pf 15x each side, inversion/eversion 15x each side; clockwise 20x counterclockwise 20x each side  Toe extension flat foot 10x    PATIENT EDUCATION:  Education details: goals, POC, HEP  Person educated: Patient Education method: Explanation, Demonstration, Tactile cues, Verbal cues, and Handouts Education comprehension: verbalized understanding, returned demonstration, verbal cues required, tactile cues required, and needs further education  HOME EXERCISE PROGRAM: Access Code: [email or web address] URL: [email or web address] Date: 06/26/2023 Prepared by: Maricarmen Braziel  Exercises - Seated Great Toe Extension  - 1 x daily - 7 x weekly - 2 sets - 10 reps - 5 hold - Seated Calf Stretch with Strap  - 1 x daily - 7 x weekly - 2 sets - 2 reps - 30 hold - Seated Ankle Alphabet  - 1 x daily - 7 x weekly - 2 sets - 10 reps - 5 hold  ASSESSMENT:  CLINICAL IMPRESSION: Patient educated on wear of orthotic and patience with changing foot as results are not immediate. Decreased plantar tension noted however increased calf tension noted. Patient remains highly motivated, reports plantar pain 80% resolved.   Pt will continue to benefit from skilled therapy to address remaining deficits in order to improve overall QoL and return to PLOF.       OBJECTIVE IMPAIRMENTS: Abnormal gait, decreased activity tolerance, decreased coordination, decreased endurance, decreased mobility, difficulty walking, decreased ROM, decreased strength, hypomobility, increased edema, increased fascial restrictions, impaired perceived functional ability, increased muscle spasms, impaired flexibility, improper body mechanics, postural dysfunction, and pain.   ACTIVITY LIMITATIONS: carrying, lifting, sitting, standing, squatting, stairs, transfers, hygiene/grooming, locomotion level,  and caring for others  PARTICIPATION LIMITATIONS: meal prep, cleaning, laundry, driving, shopping, community activity, occupation, and yard work  PERSONAL FACTORS: Age, Behavior pattern, Past/current experiences, Profession, Time since onset of injury/illness/exacerbation, and 3+ comorbidities: stage 2 moderate rheumatoid arthritis, lymphedema, HTN, GERD  are also affecting patient's functional outcome.   REHAB POTENTIAL: Good  CLINICAL DECISION MAKING: Evolving/moderate complexity  EVALUATION COMPLEXITY: Moderate   GOALS: Goals reviewed with patient? Yes  SHORT TERM GOALS: Target date: 07/24/2023  Patient will be independent in home exercise program to improve strength/mobility for better functional independence with ADLs. Baseline: Goal status: INITIAL    LONG TERM GOALS: Target date: 08/21/2023  Patient will report a worst pain of 3/10 on VAS in plantar aspect of foot to improve tolerance with ADLs and reduced symptoms with activities.  Baseline: 3/5: 9/10  Goal status: INITIAL  2.  Patient will achieve >10 degrees of DF to improve foot clearance and positioning.  Baseline: -12 AROM  Goal status: INITIAL  3.  Patient will return to gym program with no limitations to return to PLOF. Baseline:  3/5: unable to go to gym  Goal status: INITIAL  4.  Patient will increase lower extremity functional scale to >70/80 to demonstrate improved functional mobility and increased tolerance with ADLs.  Baseline:  3/5: 55/80  Goal status: INITIAL     PLAN:  PT FREQUENCY: 2x/week  PT DURATION: 8 weeks  PLANNED INTERVENTIONS: 97164- PT Re-evaluation, 97110-Therapeutic exercises, 97530- Therapeutic activity, [id number]- Neuromuscular re-education, 97535- Self Care, 40981- Manual therapy, [phone]- Gait training, [phone]- Orthotic Fit/training, [phone]- Canalith repositioning, [id number]- Splinting, M5784- Electrical stimulation (unattended), [phone]- Electrical stimulation (manual), 97016- Vasopneumatic  device, [id number]- Ultrasound, 52841- Traction (mechanical), Patient/Family education, Balance training, Stair training, Taping, Dry Needling, Joint mobilization, Joint manipulation, Spinal mobilization, Compression bandaging, Vestibular training, Visual/preceptual remediation/compensation, DME instructions, Cryotherapy, and Moist heat  PLAN FOR NEXT SESSION:  needle anterior calf, iastm to plantar surface of foot, mobilize anterior shift to neutral alignment, test MMT, ankle stabilizers   Suzana Sohail  Brain Cahill, PT, DPT Physical Therapist - [facility]  Outpatient  Physical Therapy- Main Campus [phone]    08/05/23, 4:37 PM

## 2023-08-05 ENCOUNTER — Ambulatory Visit

## 2023-08-05 DIAGNOSIS — R262 Difficulty in walking, not elsewhere classified: Secondary | ICD-10-CM | POA: Diagnosis not present

## 2023-08-05 DIAGNOSIS — M25671 Stiffness of right ankle, not elsewhere classified: Secondary | ICD-10-CM

## 2023-08-05 DIAGNOSIS — M25571 Pain in right ankle and joints of right foot: Secondary | ICD-10-CM

## 2023-08-06 NOTE — Therapy (Signed)
 OUTPATIENT PHYSICAL THERAPY LOWER EXTREMITY TREATMENT   Patient Name: Victoria Berry MRN: [id number] DOB:May 02, 1970, 53 y.o., female Today's Date: 08/07/2023  END OF SESSION:  PT End of Session - 08/07/23 0713     Visit Number 9    Number of Visits 16    Date for PT Re-Evaluation 08/21/23    PT Start Time 0714    PT Stop Time 0758    PT Time Calculation (min) 44 min    Activity Tolerance Patient tolerated treatment well;Patient limited by pain    Behavior During Therapy [facility] for tasks assessed/performed                   Past Medical History:  Diagnosis Date   GERD (gastroesophageal reflux disease)    Hypertension    Obesity    RA (rheumatoid arthritis) (HCC)    Past Surgical History:  Procedure Laterality Date   CESAREAN SECTION  2000   twins   COLONOSCOPY WITH PROPOFOL N/A 06/24/2015   Procedure: COLONOSCOPY WITH PROPOFOL;  Surgeon: Elnita Maxwell, MD;  Location: [facility] ENDOSCOPY;  Service: Endoscopy;  Laterality: N/A;   ESOPHAGOGASTRODUODENOSCOPY (EGD) WITH PROPOFOL N/A 06/24/2015   Procedure: ESOPHAGOGASTRODUODENOSCOPY (EGD) WITH PROPOFOL;  Surgeon: Elnita Maxwell, MD;  Location: [facility] ENDOSCOPY;  Service: Endoscopy;  Laterality: N/A;   right ureter     TONSILLECTOMY     Patient Active Problem List   Diagnosis Date Noted   Kidney cyst, acquired 03/14/2022   Intermittent palpitations 06/03/2019   Pulsatile tinnitus, right ear 12/25/2017   Anxiety state 01/16/2017   Major depression, melancholic type 01/16/2017   Acquired equinus deformity of both feet 10/25/2016   Chronic pain of right ankle 04/25/2016   Morbid obesity with BMI of 45.0-49.9, adult (HCC) 12/21/2015   CRP elevated 07/20/2015   Encounter for long-term (current) use of high-risk medication 04/20/2015   Mild obesity 04/20/2015   Rheumatoid arthritis of multiple sites with negative rheumatoid factor (HCC) 04/20/2015   Macular degeneration, dry 02/12/2014   GERD  (gastroesophageal reflux disease) 11/27/2013    PCP: Althea Grimmer  REFERRING PROVIDER: Milderd Meager   REFERRING DIAG: Peroneal tendonitis   THERAPY DIAG:  Difficulty in walking, not elsewhere classified  Stiffness of right ankle, not elsewhere classified  Pain in right ankle and joints of right foot  Rationale for Evaluation and Treatment: Rehabilitation  ONSET DATE: 5 years ago   SUBJECTIVE:   SUBJECTIVE STATEMENT:  Patient reports yesterday was a great day but did wake up with some pain today.    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 Physician requests additional checking of hip and LE ROM.  PMH includes stage 2 moderate rheumatoid arthritis, lymphedema, HTN, GERD.  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 Has flair ups. 5 years ago noticed R foot arch falling. Walk with in toe when plantar surface hurts.  Has good history of dry needling   PAIN:  Are you having pain? Yes: NPRS scale: 9/10 Pain location: heel, plantar surfaces Pain description: stabbing  Aggravating factors: dressing Relieving factors: n/a, rest   Plantar surface: worst 9/10 Midfoot, arch and medial tendons: worst 7/10   PRECAUTIONS: None  RED FLAGS: None   WEIGHT BEARING RESTRICTIONS: No  FALLS:  Has patient fallen in last 6 months? No  LIVING ENVIRONMENT: Lives with: lives with their family Lives in: House/apartment Stairs: No   OCCUPATION: Armed forces operational officer  PLOF: Independent  PATIENT GOALS: to go to the gym   NEXT MD VISIT: n/a  OBJECTIVE:  Note: Objective measures were completed at Evaluation unless otherwise noted.  DIAGNOSTIC FINDINGS: n/a  PATIENT SURVEYS:  LEFS 55  COGNITION: Overall cognitive status: Within functional limits for tasks assessed    EDEMA:  Hx of active lymphedema management in bilateral LE  MUSCLE LENGTH: Limited calf length: see AROM  resting position below  POSTURE: weight shift left  PALPATION: Tenderness to plantar aspect of foot and dorsal region of talus   LOWER EXTREMITY ROM:  Active ROM Right eval Left eval  Hip flexion    Hip extension    Hip abduction    Hip adduction    Hip internal rotation    Hip external rotation    Knee flexion    Knee extension    Ankle dorsiflexion AROM: -12 PROM; -2 AROM 5 PROM 10  Ankle plantarflexion AROM: 5 degrees actively from neutral position AROM 30  Ankle inversion    Ankle eversion     (Blank rows = not tested)  Neutral position: 39 R foot   Trunk Flexion WFL  Trunk Extension WFL  Trunk R SB WFL  Trunk L SB WFL  Trunk R rotation WFL  Trunk L rotation WFL    LOWER EXTREMITY MMT: Test Next Session   MMT Right eval Left eval  Hip flexion    Hip extension    Hip abduction    Hip adduction    Hip internal rotation    Hip external rotation    Knee flexion    Knee extension    Ankle dorsiflexion    Ankle plantarflexion    Ankle inversion    Ankle eversion     (Blank rows = not tested)  LOWER EXTREMITY SPECIAL TESTS:  Ankle special test:  Eversion stress test: negative  Anterior drawer test :positive Talar tilt test: positive  FUNCTIONAL TESTS:  10 meter walk test: 8 seconds with antalgic gait pattern  GAIT: Distance walked: 40 ft Assistive device utilized: None Level of assistance: SBA and CGA Comments: severe antalgic gait pattern with significant weight shift to LLE.                                                                                                                                 TREATMENT DATE: 08/07/23   Manual:  iaSTM to plantar surface of foot and achilles x 15 minutes with multiple areas of adhesions. STM to R anterior and posterior calf for tension reduction  Long sitting calcaneal distraction 3x30 seconds Long sitting calcaneal medial/lateral mobilization: multiple repetitions Talocrural AP: x multiple  repetitions, 30 sec bouts Fan metatarsal mobilizations, Grades II-III, for improved ROM of the intrinsic foot musculature, 30 sec bouts spent each segment   TherEx:  RTB side stepping 4x length of // bars Stair stretch 2x 60 seconds each LE  Neuro Re-ed Standing with CGA next  to support surface:  Airex pad: static stand 30 seconds x 2 trials, noticeable trembling of ankles/LE's with fatigue and challenge to maintain stability Airex pad: horizontal head turns 30 seconds scanning room 10x ; cueing for arc of motion  Airex pad: vertical head turns 30 seconds, cueing for arc of motion, noticeable sway with upward gaze increasing demand on ankle righting reaction musculature Airex pad: one foot on 6" step one foot on airex pad, hold position for 30 seconds, switch legs, 2x each LE;    PATIENT EDUCATION:  Education details: goals, POC, HEP  Person educated: Patient Education method: Explanation, Demonstration, Tactile cues, Verbal cues, and Handouts Education comprehension: verbalized understanding, returned demonstration, verbal cues required, tactile cues required, and needs further education  HOME EXERCISE PROGRAM: Access Code: [email or web address] URL: [email or web address] Date: 06/26/2023 Prepared by: Precious Bard  Exercises - Seated Great Toe Extension  - 1 x daily - 7 x weekly - 2 sets - 10 reps - 5 hold - Seated Calf Stretch with Strap  - 1 x daily - 7 x weekly - 2 sets - 2 reps - 30 hold - Seated Ankle Alphabet  - 1 x daily - 7 x weekly - 2 sets - 10 reps - 5 hold  ASSESSMENT:  CLINICAL IMPRESSION: Patient introduced to progressive ankle and foot stabilization interventions on airex pad in socks to increase foot involvement. She is highly motivated and is eager to progress her pain free mobility. Single leg stability continues to be an area of progress.  Pt will continue to benefit from skilled therapy to address remaining deficits in order to improve overall QoL and return  to PLOF.       OBJECTIVE IMPAIRMENTS: Abnormal gait, decreased activity tolerance, decreased coordination, decreased endurance, decreased mobility, difficulty walking, decreased ROM, decreased strength, hypomobility, increased edema, increased fascial restrictions, impaired perceived functional ability, increased muscle spasms, impaired flexibility, improper body mechanics, postural dysfunction, and pain.   ACTIVITY LIMITATIONS: carrying, lifting, sitting, standing, squatting, stairs, transfers, hygiene/grooming, locomotion level, and caring for others  PARTICIPATION LIMITATIONS: meal prep, cleaning, laundry, driving, shopping, community activity, occupation, and yard work  PERSONAL FACTORS: Age, Behavior pattern, Past/current experiences, Profession, Time since onset of injury/illness/exacerbation, and 3+ comorbidities: stage 2 moderate rheumatoid arthritis, lymphedema, HTN, GERD  are also affecting patient's functional outcome.   REHAB POTENTIAL: Good  CLINICAL DECISION MAKING: Evolving/moderate complexity  EVALUATION COMPLEXITY: Moderate   GOALS: Goals reviewed with patient? Yes  SHORT TERM GOALS: Target date: 07/24/2023  Patient will be independent in home exercise program to improve strength/mobility for better functional independence with ADLs. Baseline: Goal status: INITIAL    LONG TERM GOALS: Target date: 08/21/2023  Patient will report a worst pain of 3/10 on VAS in plantar aspect of foot to improve tolerance with ADLs and reduced symptoms with activities.  Baseline: 3/5: 9/10  Goal status: INITIAL  2.  Patient will achieve >10 degrees of DF to improve foot clearance and positioning.  Baseline: -12 AROM  Goal status: INITIAL  3.  Patient will return to gym program with no limitations to return to PLOF. Baseline:  3/5: unable to go to gym  Goal status: INITIAL  4.  Patient will increase lower extremity functional scale to >70/80 to demonstrate improved functional  mobility and increased tolerance with ADLs.  Baseline:  3/5: 55/80  Goal status: INITIAL     PLAN:  PT FREQUENCY: 2x/week  PT DURATION: 8 weeks  PLANNED INTERVENTIONS: 97164- PT Re-evaluation, 97110-Therapeutic exercises, 97530- Therapeutic activity,  16109- Neuromuscular re-education, [phone]- Self Care, 09811- Manual therapy, [phone]- Gait training, [phone]- Orthotic Fit/training, [phone]- Canalith repositioning, [id number]- Splinting, [phone]- Electrical stimulation (unattended), [phone]- Electrical stimulation (manual), [id number]- Vasopneumatic device, [id number]- Ultrasound, [id number]- Traction (mechanical), Patient/Family education, Balance training, Stair training, Taping, Dry Needling, Joint mobilization, Joint manipulation, Spinal mobilization, Compression bandaging, Vestibular training, Visual/preceptual remediation/compensation, DME instructions, Cryotherapy, and Moist heat  PLAN FOR NEXT SESSION:  needle anterior calf, iastm to plantar surface of foot, mobilize anterior shift to neutral alignment, test MMT, ankle stabilizers   Leeza Heiner  Brain Cahill, PT, DPT Physical Therapist - [facility]  Outpatient Physical Therapy- Main Campus [phone]    08/07/23, 9:20 AM

## 2023-08-07 ENCOUNTER — Ambulatory Visit

## 2023-08-07 DIAGNOSIS — R262 Difficulty in walking, not elsewhere classified: Secondary | ICD-10-CM | POA: Diagnosis not present

## 2023-08-07 DIAGNOSIS — M25671 Stiffness of right ankle, not elsewhere classified: Secondary | ICD-10-CM

## 2023-08-07 DIAGNOSIS — M25571 Pain in right ankle and joints of right foot: Secondary | ICD-10-CM

## 2023-08-12 ENCOUNTER — Ambulatory Visit

## 2023-08-13 NOTE — Therapy (Signed)
 OUTPATIENT PHYSICAL THERAPY LOWER EXTREMITY TREATMENT/ Physical Therapy Progress Note/RECERT   Dates of reporting period  06/26/23   to   08/14/23     Patient Name: Victoria Berry MRN: [id number] DOB:06-Dec-1970, 53 y.o., female Today's Date: 08/14/2023  END OF SESSION:  PT End of Session - 08/14/23 0717     Visit Number 10    Number of Visits 26    Date for PT Re-Evaluation 10/09/23    PT Start Time 0717    PT Stop Time 0758    PT Time Calculation (min) 41 min    Activity Tolerance Patient tolerated treatment well;Patient limited by pain    Behavior During Therapy [facility] for tasks assessed/performed                    Past Medical History:  Diagnosis Date   GERD (gastroesophageal reflux disease)    Hypertension    Obesity    RA (rheumatoid arthritis) (HCC)    Past Surgical History:  Procedure Laterality Date   CESAREAN SECTION  2000   twins   COLONOSCOPY WITH PROPOFOL  N/A 06/24/2015   Procedure: COLONOSCOPY WITH PROPOFOL ;  Surgeon: Luella Sager, MD;  Location: [facility] ENDOSCOPY;  Service: Endoscopy;  Laterality: N/A;   ESOPHAGOGASTRODUODENOSCOPY (EGD) WITH PROPOFOL  N/A 06/24/2015   Procedure: ESOPHAGOGASTRODUODENOSCOPY (EGD) WITH PROPOFOL ;  Surgeon: Luella Sager, MD;  Location: [facility] ENDOSCOPY;  Service: Endoscopy;  Laterality: N/A;   right ureter     TONSILLECTOMY     Patient Active Problem List   Diagnosis Date Noted   Kidney cyst, acquired 03/14/2022   Intermittent palpitations 06/03/2019   Pulsatile tinnitus, right ear 12/25/2017   Anxiety state 01/16/2017   Major depression, melancholic type 01/16/2017   Acquired equinus deformity of both feet 10/25/2016   Chronic pain of right ankle 04/25/2016   Morbid obesity with BMI of 45.0-49.9, adult (HCC) 12/21/2015   CRP elevated 07/20/2015   Encounter for long-term (current) use of high-risk medication 04/20/2015   Mild obesity 04/20/2015   Rheumatoid arthritis of multiple sites with  negative rheumatoid factor (HCC) 04/20/2015   Macular degeneration, dry 02/12/2014   GERD (gastroesophageal reflux disease) 11/27/2013    PCP: Alyn Judge  REFERRING PROVIDER: Edward Graff   REFERRING DIAG: Peroneal tendonitis   THERAPY DIAG:  Difficulty in walking, not elsewhere classified  Stiffness of right ankle, not elsewhere classified  Pain in right ankle and joints of right foot  Rationale for Evaluation and Treatment: Rehabilitation  ONSET DATE: 5 years ago   SUBJECTIVE:   SUBJECTIVE STATEMENT:  Patient reports she has noticed significant improvement, reports 75% better but not yet ready for discharge.    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 Physician requests additional checking of hip and LE ROM.  PMH includes stage 2 moderate rheumatoid arthritis, lymphedema, HTN, GERD.  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 Has flair ups. 5 years ago noticed R foot arch falling. Walk with in toe when plantar surface hurts.  Has good history of dry needling   PAIN:  Are you having pain? Yes: NPRS scale: 9/10 Pain location: heel, plantar surfaces Pain description: stabbing  Aggravating factors: dressing Relieving factors: n/a, rest   Plantar surface: worst 9/10 Midfoot, arch and medial tendons: worst 7/10   PRECAUTIONS: None  RED FLAGS: None   WEIGHT BEARING RESTRICTIONS: No  FALLS:  Has patient fallen in last 6 months?  No  LIVING ENVIRONMENT: Lives with: lives with their family Lives in: House/apartment Stairs: No   OCCUPATION: Armed forces operational officer   PLOF: Independent  PATIENT GOALS: to go to the gym   NEXT MD VISIT: n/a  OBJECTIVE:  Note: Objective measures were completed at Evaluation unless otherwise noted.  DIAGNOSTIC FINDINGS: n/a  PATIENT SURVEYS:  LEFS 55  COGNITION: Overall cognitive status: Within functional limits for tasks  assessed    EDEMA:  Hx of active lymphedema management in bilateral LE  MUSCLE LENGTH: Limited calf length: see AROM resting position below  POSTURE: weight shift left  PALPATION: Tenderness to plantar aspect of foot and dorsal region of talus   LOWER EXTREMITY ROM:  Active ROM Right eval Left eval  Hip flexion    Hip extension    Hip abduction    Hip adduction    Hip internal rotation    Hip external rotation    Knee flexion    Knee extension    Ankle dorsiflexion AROM: -12 PROM; -2 AROM 5 PROM 10  Ankle plantarflexion AROM: 5 degrees actively from neutral position AROM 30  Ankle inversion    Ankle eversion     (Blank rows = not tested)  Neutral position: 39 R foot   Trunk Flexion WFL  Trunk Extension WFL  Trunk R SB WFL  Trunk L SB WFL  Trunk R rotation WFL  Trunk L rotation WFL    LOWER EXTREMITY MMT: Test Next Session   MMT Right eval Left eval  Hip flexion    Hip extension    Hip abduction    Hip adduction    Hip internal rotation    Hip external rotation    Knee flexion    Knee extension    Ankle dorsiflexion    Ankle plantarflexion    Ankle inversion    Ankle eversion     (Blank rows = not tested)  LOWER EXTREMITY SPECIAL TESTS:  Ankle special test:  Eversion stress test: negative  Anterior drawer test :positive Talar tilt test: positive  FUNCTIONAL TESTS:  10 meter walk test: 8 seconds with antalgic gait pattern  GAIT: Distance walked: 40 ft Assistive device utilized: None Level of assistance: SBA and CGA Comments: severe antalgic gait pattern with significant weight shift to LLE.                                                                                                                                 TREATMENT DATE: 08/14/23  Physical therapy treatment session today consisted of completing assessment of goals and administration of testing as demonstrated and documented in flow sheet, treatment, and goals section of this  note. Addition treatments may be found below.    Manual:  iaSTM to plantar surface of foot and achilles x 18 minutes with multiple areas of adhesions. STM to R anterior and posterior calf for tension reduction  Long sitting calcaneal distraction 3x30 seconds Long  sitting calcaneal medial/lateral mobilization: multiple repetitions Talocrural AP: x multiple repetitions, 30 sec bouts Fan metatarsal mobilizations, Grades II-III, for improved ROM of the intrinsic foot musculature, 30 sec bouts spent each segment      PATIENT EDUCATION:  Education details: goals, POC, HEP  Person educated: Patient Education method: Explanation, Demonstration, Tactile cues, Verbal cues, and Handouts Education comprehension: verbalized understanding, returned demonstration, verbal cues required, tactile cues required, and needs further education  HOME EXERCISE PROGRAM: Access Code: [email or web address] URL: [email or web address] Date: 06/26/2023 Prepared by: Landis Ping  Exercises - Seated Great Toe Extension  - 1 x daily - 7 x weekly - 2 sets - 10 reps - 5 hold - Seated Calf Stretch with Strap  - 1 x daily - 7 x weekly - 2 sets - 2 reps - 30 hold - Seated Ankle Alphabet  - 1 x daily - 7 x weekly - 2 sets - 10 reps - 5 hold  ASSESSMENT:  CLINICAL IMPRESSION:  Patient's condition has the potential to improve in response to therapy. Maximum improvement is yet to be obtained. The anticipated improvement is attainable and reasonable in a generally predictable time.  Patient has improved ROM and decreased pain. Her foot does continue to impact her daily life but has decreased frequency of pain and impact during the day. Her LEFS demonstrates this with an almost ten point improvement. Patient is highly motivated and will benefit from an additional round of physical therapy to return to full activity.  Pt will continue to benefit from skilled therapy to address remaining deficits in order to improve overall  QoL and return to PLOF.       OBJECTIVE IMPAIRMENTS: Abnormal gait, decreased activity tolerance, decreased coordination, decreased endurance, decreased mobility, difficulty walking, decreased ROM, decreased strength, hypomobility, increased edema, increased fascial restrictions, impaired perceived functional ability, increased muscle spasms, impaired flexibility, improper body mechanics, postural dysfunction, and pain.   ACTIVITY LIMITATIONS: carrying, lifting, sitting, standing, squatting, stairs, transfers, hygiene/grooming, locomotion level, and caring for others  PARTICIPATION LIMITATIONS: meal prep, cleaning, laundry, driving, shopping, community activity, occupation, and yard work  PERSONAL FACTORS: Age, Behavior pattern, Past/current experiences, Profession, Time since onset of injury/illness/exacerbation, and 3+ comorbidities: stage 2 moderate rheumatoid arthritis, lymphedema, HTN, GERD  are also affecting patient's functional outcome.   REHAB POTENTIAL: Good  CLINICAL DECISION MAKING: Evolving/moderate complexity  EVALUATION COMPLEXITY: Moderate   GOALS: Goals reviewed with patient? Yes  SHORT TERM GOALS: Target date: 07/24/2023  Patient will be independent in home exercise program to improve strength/mobility for better functional independence with ADLs. Baseline:compliant Goal status: MET    LONG TERM GOALS: Target date: 10/09/2023  Patient will report a worst pain of 3/10 on VAS in plantar aspect of foot to improve tolerance with ADLs and reduced symptoms with activities.  Baseline: 3/5: 9/10 4/23:  7/10  Goal status: In Progress  2.  Patient will achieve >10 degrees of DF to improve foot clearance and positioning.  Baseline: -12 AROM 4/12: 1 degree of DF AROM Goal status: Partially Met  3.  Patient will return to gym program with no limitations to return to PLOF. Baseline:  3/5: unable to go to gym  4/23: return to gym; unable to perform elliptical for as long as  she likes Goal status: Partially Met   4.  Patient will increase lower extremity functional scale to >70/80 to demonstrate improved functional mobility and increased tolerance with ADLs.  Baseline:  3/5: 55/80 4/23: 65/80 Goal status: Partially Met  PLAN:  PT FREQUENCY: 2x/week  PT DURATION: 8 weeks  PLANNED INTERVENTIONS: 97164- PT Re-evaluation, 97110-Therapeutic exercises, 97530- Therapeutic activity, 97112- Neuromuscular re-education, 97535- Self Care, 16109- Manual therapy, [phone]- Gait training, [phone]- Orthotic Fit/training, [phone]- Canalith repositioning, [id number]- Splinting, G9562- Electrical stimulation (unattended), [phone]- Electrical stimulation (manual), 97016- Vasopneumatic device, [id number]- Ultrasound, [id number]- Traction (mechanical), Patient/Family education, Balance training, Stair training, Taping, Dry Needling, Joint mobilization, Joint manipulation, Spinal mobilization, Compression bandaging, Vestibular training, Visual/preceptual remediation/compensation, DME instructions, Cryotherapy, and Moist heat  PLAN FOR NEXT SESSION:  needle anterior calf, iastm to plantar surface of foot, mobilize anterior shift to neutral alignment, test MMT, ankle stabilizers   Tye Vigo  Brain Cahill, PT, DPT Physical Therapist - [facility]  Outpatient Physical Therapy- Main Campus [phone]    08/14/23, 7:58 AM

## 2023-08-14 ENCOUNTER — Ambulatory Visit

## 2023-08-14 DIAGNOSIS — R262 Difficulty in walking, not elsewhere classified: Secondary | ICD-10-CM | POA: Diagnosis not present

## 2023-08-14 DIAGNOSIS — M25571 Pain in right ankle and joints of right foot: Secondary | ICD-10-CM

## 2023-08-14 DIAGNOSIS — M25671 Stiffness of right ankle, not elsewhere classified: Secondary | ICD-10-CM

## 2023-08-15 NOTE — Therapy (Signed)
 OUTPATIENT PHYSICAL THERAPY LOWER EXTREMITY TREATMENT     Patient Name: Victoria Berry MRN: [id number] DOB:01/30/1971, 53 y.o., female Today's Date: 08/19/2023  END OF SESSION:  PT End of Session - 08/19/23 0715     Visit Number 11    Number of Visits 26    Date for PT Re-Evaluation 10/09/23    PT Start Time 0715    PT Stop Time 0758    PT Time Calculation (min) 43 min    Activity Tolerance Patient tolerated treatment well;Patient limited by pain    Behavior During Therapy [facility] for tasks assessed/performed                     Past Medical History:  Diagnosis Date   GERD (gastroesophageal reflux disease)    Hypertension    Obesity    RA (rheumatoid arthritis) (HCC)    Past Surgical History:  Procedure Laterality Date   CESAREAN SECTION  2000   twins   COLONOSCOPY WITH PROPOFOL  N/A 06/24/2015   Procedure: COLONOSCOPY WITH PROPOFOL ;  Surgeon: Luella Sager, MD;  Location: [facility] ENDOSCOPY;  Service: Endoscopy;  Laterality: N/A;   ESOPHAGOGASTRODUODENOSCOPY (EGD) WITH PROPOFOL  N/A 06/24/2015   Procedure: ESOPHAGOGASTRODUODENOSCOPY (EGD) WITH PROPOFOL ;  Surgeon: Luella Sager, MD;  Location: [facility] ENDOSCOPY;  Service: Endoscopy;  Laterality: N/A;   right ureter     TONSILLECTOMY     Patient Active Problem List   Diagnosis Date Noted   Kidney cyst, acquired 03/14/2022   Intermittent palpitations 06/03/2019   Pulsatile tinnitus, right ear 12/25/2017   Anxiety state 01/16/2017   Major depression, melancholic type 01/16/2017   Acquired equinus deformity of both feet 10/25/2016   Chronic pain of right ankle 04/25/2016   Morbid obesity with BMI of 45.0-49.9, adult (HCC) 12/21/2015   CRP elevated 07/20/2015   Encounter for long-term (current) use of high-risk medication 04/20/2015   Mild obesity 04/20/2015   Rheumatoid arthritis of multiple sites with negative rheumatoid factor (HCC) 04/20/2015   Macular degeneration, dry 02/12/2014   GERD  (gastroesophageal reflux disease) 11/27/2013    PCP: Alyn Judge  REFERRING PROVIDER: Edward Graff   REFERRING DIAG: Peroneal tendonitis   THERAPY DIAG:  Difficulty in walking, not elsewhere classified  Stiffness of right ankle, not elsewhere classified  Pain in right ankle and joints of right foot  Rationale for Evaluation and Treatment: Rehabilitation  ONSET DATE: 5 years ago   SUBJECTIVE:   SUBJECTIVE STATEMENT:  Patient reports she worked in the garden over the weekend.   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 Physician requests additional checking of hip and LE ROM.  PMH includes stage 2 moderate rheumatoid arthritis, lymphedema, HTN, GERD.  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 Has flair ups. 5 years ago noticed R foot arch falling. Walk with in toe when plantar surface hurts.  Has good history of dry needling   PAIN:  Are you having pain? Yes: NPRS scale: 9/10 Pain location: heel, plantar surfaces Pain description: stabbing  Aggravating factors: dressing Relieving factors: n/a, rest   Plantar surface: worst 9/10 Midfoot, arch and medial tendons: worst 7/10   PRECAUTIONS: None  RED FLAGS: None   WEIGHT BEARING RESTRICTIONS: No  FALLS:  Has patient fallen in last 6 months? No  LIVING ENVIRONMENT: Lives with: lives with their family Lives in: House/apartment Stairs: No   OCCUPATION: Armed forces operational officer   PLOF: Independent  PATIENT GOALS: to go to the gym   NEXT MD VISIT: n/a  OBJECTIVE:  Note: Objective measures were completed at Evaluation unless otherwise noted.  DIAGNOSTIC FINDINGS: n/a  PATIENT SURVEYS:  LEFS 55  COGNITION: Overall cognitive status: Within functional limits for tasks assessed    EDEMA:  Hx of active lymphedema management in bilateral LE  MUSCLE LENGTH: Limited calf length: see AROM resting position  below  POSTURE: weight shift left  PALPATION: Tenderness to plantar aspect of foot and dorsal region of talus   LOWER EXTREMITY ROM:  Active ROM Right eval Left eval  Hip flexion    Hip extension    Hip abduction    Hip adduction    Hip internal rotation    Hip external rotation    Knee flexion    Knee extension    Ankle dorsiflexion AROM: -12 PROM; -2 AROM 5 PROM 10  Ankle plantarflexion AROM: 5 degrees actively from neutral position AROM 30  Ankle inversion    Ankle eversion     (Blank rows = not tested)  Neutral position: 39 R foot   Trunk Flexion WFL  Trunk Extension WFL  Trunk R SB WFL  Trunk L SB WFL  Trunk R rotation WFL  Trunk L rotation WFL    LOWER EXTREMITY MMT: Test Next Session   MMT Right eval Left eval  Hip flexion    Hip extension    Hip abduction    Hip adduction    Hip internal rotation    Hip external rotation    Knee flexion    Knee extension    Ankle dorsiflexion    Ankle plantarflexion    Ankle inversion    Ankle eversion     (Blank rows = not tested)  LOWER EXTREMITY SPECIAL TESTS:  Ankle special test:  Eversion stress test: negative  Anterior drawer test :positive Talar tilt test: positive  FUNCTIONAL TESTS:  10 meter walk test: 8 seconds with antalgic gait pattern  GAIT: Distance walked: 40 ft Assistive device utilized: None Level of assistance: SBA and CGA Comments: severe antalgic gait pattern with significant weight shift to LLE.                                                                                                                                 TREATMENT DATE: 08/19/23    Manual:  iaSTM to plantar surface of foot and achilles x 18 minutes with multiple areas of adhesions. STM to R anterior and posterior calf for tension reduction  Long sitting calcaneal distraction 3x30 seconds Long sitting calcaneal medial/lateral mobilization: multiple repetitions Talocrural AP: x multiple repetitions, 30 sec  bouts Fan metatarsal mobilizations, Grades II-III, for improved ROM of the intrinsic foot musculature, 30 sec bouts spent each segment  Neuro Re-ed: -airex pad static stand 60 seconds -single leg stance 60 seconds -march 10x each LE -squat with focus on toe curl 10x Half foam roller  df/pf 10x      PATIENT EDUCATION:  Education details: goals, POC, HEP  Person educated: Patient Education method: Explanation, Demonstration, Tactile cues, Verbal cues, and Handouts Education comprehension: verbalized understanding, returned demonstration, verbal cues required, tactile cues required, and needs further education  HOME EXERCISE PROGRAM: Access Code: [email or web address] URL: [email or web address] Date: 06/26/2023 Prepared by: Debby Clyne  Exercises - Seated Great Toe Extension  - 1 x daily - 7 x weekly - 2 sets - 10 reps - 5 hold - Seated Calf Stretch with Strap  - 1 x daily - 7 x weekly - 2 sets - 2 reps - 30 hold - Seated Ankle Alphabet  - 1 x daily - 7 x weekly - 2 sets - 10 reps - 5 hold  ASSESSMENT:  CLINICAL IMPRESSION: Patient tolerates progressive ankle righting reactions and intrinsic muscle strengthening without pain increase. She require significant release of musculature of plantar surface of foot to allow for mobilization of first met.  Pt will continue to benefit from skilled therapy to address remaining deficits in order to improve overall QoL and return to PLOF.       OBJECTIVE IMPAIRMENTS: Abnormal gait, decreased activity tolerance, decreased coordination, decreased endurance, decreased mobility, difficulty walking, decreased ROM, decreased strength, hypomobility, increased edema, increased fascial restrictions, impaired perceived functional ability, increased muscle spasms, impaired flexibility, improper body mechanics, postural dysfunction, and pain.   ACTIVITY LIMITATIONS: carrying, lifting, sitting, standing, squatting, stairs, transfers, hygiene/grooming,  locomotion level, and caring for others  PARTICIPATION LIMITATIONS: meal prep, cleaning, laundry, driving, shopping, community activity, occupation, and yard work  PERSONAL FACTORS: Age, Behavior pattern, Past/current experiences, Profession, Time since onset of injury/illness/exacerbation, and 3+ comorbidities: stage 2 moderate rheumatoid arthritis, lymphedema, HTN, GERD  are also affecting patient's functional outcome.   REHAB POTENTIAL: Good  CLINICAL DECISION MAKING: Evolving/moderate complexity  EVALUATION COMPLEXITY: Moderate   GOALS: Goals reviewed with patient? Yes  SHORT TERM GOALS: Target date: 07/24/2023  Patient will be independent in home exercise program to improve strength/mobility for better functional independence with ADLs. Baseline:compliant Goal status: MET    LONG TERM GOALS: Target date: 10/09/2023  Patient will report a worst pain of 3/10 on VAS in plantar aspect of foot to improve tolerance with ADLs and reduced symptoms with activities.  Baseline: 3/5: 9/10 4/23:  7/10  Goal status: In Progress  2.  Patient will achieve >10 degrees of DF to improve foot clearance and positioning.  Baseline: -12 AROM 4/12: 1 degree of DF AROM Goal status: Partially Met  3.  Patient will return to gym program with no limitations to return to PLOF. Baseline:  3/5: unable to go to gym  4/23: return to gym; unable to perform elliptical for as long as she likes Goal status: Partially Met   4.  Patient will increase lower extremity functional scale to >70/80 to demonstrate improved functional mobility and increased tolerance with ADLs.  Baseline:  3/5: 55/80 4/23: 65/80 Goal status: Partially Met     PLAN:  PT FREQUENCY: 2x/week  PT DURATION: 8 weeks  PLANNED INTERVENTIONS: 97164- PT Re-evaluation, 97110-Therapeutic exercises, 97530- Therapeutic activity, 97112- Neuromuscular re-education, 97535- Self Care, 86578- Manual therapy, [id number]- Gait training, [phone]- Orthotic  Fit/training, [phone]- Canalith repositioning, [id number]- Splinting, X3244- Electrical stimulation (unattended), [id number]- Electrical stimulation (manual), [id number]- Vasopneumatic device, [id number]- Ultrasound, [id number]- Traction (mechanical), Patient/Family education, Balance training, Stair training, Taping, Dry Needling, Joint mobilization, Joint manipulation, Spinal mobilization, Compression bandaging, Vestibular training, Visual/preceptual remediation/compensation, DME instructions, Cryotherapy, and Moist heat  PLAN  FOR NEXT SESSION:  needle anterior calf, iastm to plantar surface of foot, mobilize anterior shift to neutral alignment, test MMT, ankle stabilizers   Kynedi Profitt  Brain Cahill, PT, DPT Physical Therapist - [facility]  Outpatient Physical Therapy- Main Campus [phone]    08/19/23, 8:00 AM

## 2023-08-19 ENCOUNTER — Ambulatory Visit

## 2023-08-19 DIAGNOSIS — M25571 Pain in right ankle and joints of right foot: Secondary | ICD-10-CM

## 2023-08-19 DIAGNOSIS — R262 Difficulty in walking, not elsewhere classified: Secondary | ICD-10-CM

## 2023-08-19 DIAGNOSIS — M25671 Stiffness of right ankle, not elsewhere classified: Secondary | ICD-10-CM

## 2023-08-20 NOTE — Therapy (Signed)
 OUTPATIENT PHYSICAL THERAPY LOWER EXTREMITY TREATMENT     Patient Name: Marena Buckhannon MRN: [id number] DOB:November 03, 1970, 53 y.o., female Today's Date: 08/21/2023  END OF SESSION:  PT End of Session - 08/21/23 0709     Visit Number 12    Number of Visits 26    Date for PT Re-Evaluation 10/09/23    PT Start Time 0712    PT Stop Time 0757    PT Time Calculation (min) 45 min    Activity Tolerance Patient tolerated treatment well;Patient limited by pain    Behavior During Therapy [facility] for tasks assessed/performed                      Past Medical History:  Diagnosis Date   GERD (gastroesophageal reflux disease)    Hypertension    Obesity    RA (rheumatoid arthritis) (HCC)    Past Surgical History:  Procedure Laterality Date   CESAREAN SECTION  2000   twins   COLONOSCOPY WITH PROPOFOL  N/A 06/24/2015   Procedure: COLONOSCOPY WITH PROPOFOL ;  Surgeon: Luella Sager, MD;  Location: [facility] ENDOSCOPY;  Service: Endoscopy;  Laterality: N/A;   ESOPHAGOGASTRODUODENOSCOPY (EGD) WITH PROPOFOL  N/A 06/24/2015   Procedure: ESOPHAGOGASTRODUODENOSCOPY (EGD) WITH PROPOFOL ;  Surgeon: Luella Sager, MD;  Location: [facility] ENDOSCOPY;  Service: Endoscopy;  Laterality: N/A;   right ureter     TONSILLECTOMY     Patient Active Problem List   Diagnosis Date Noted   Kidney cyst, acquired 03/14/2022   Intermittent palpitations 06/03/2019   Pulsatile tinnitus, right ear 12/25/2017   Anxiety state 01/16/2017   Major depression, melancholic type 01/16/2017   Acquired equinus deformity of both feet 10/25/2016   Chronic pain of right ankle 04/25/2016   Morbid obesity with BMI of 45.0-49.9, adult (HCC) 12/21/2015   CRP elevated 07/20/2015   Encounter for long-term (current) use of high-risk medication 04/20/2015   Mild obesity 04/20/2015   Rheumatoid arthritis of multiple sites with negative rheumatoid factor (HCC) 04/20/2015   Macular degeneration, dry 02/12/2014   GERD  (gastroesophageal reflux disease) 11/27/2013    PCP: Alyn Judge  REFERRING PROVIDER: Edward Graff   REFERRING DIAG: Peroneal tendonitis   THERAPY DIAG:  Difficulty in walking, not elsewhere classified  Stiffness of right ankle, not elsewhere classified  Pain in right ankle and joints of right foot  Rationale for Evaluation and Treatment: Rehabilitation  ONSET DATE: 5 years ago   SUBJECTIVE:   SUBJECTIVE STATEMENT:  Patient reports her foot feels slightly better. Will have a few more appts due to financial reasons.   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 Physician requests additional checking of hip and LE ROM.  PMH includes stage 2 moderate rheumatoid arthritis, lymphedema, HTN, GERD.  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 Has flair ups. 5 years ago noticed R foot arch falling. Walk with in toe when plantar surface hurts.  Has good history of dry needling   PAIN:  Are you having pain? Yes: NPRS scale: 9/10 Pain location: heel, plantar surfaces Pain description: stabbing  Aggravating factors: dressing Relieving factors: n/a, rest   Plantar surface: worst 9/10 Midfoot, arch and medial tendons: worst 7/10   PRECAUTIONS: None  RED FLAGS: None   WEIGHT BEARING RESTRICTIONS: No  FALLS:  Has patient fallen in last 6 months? No  LIVING ENVIRONMENT: Lives with: lives with their family Lives in: House/apartment Stairs: No  OCCUPATION: dental hygienist   PLOF: Independent  PATIENT GOALS: to go to the gym   NEXT MD VISIT: n/a  OBJECTIVE:  Note: Objective measures were completed at Evaluation unless otherwise noted.  DIAGNOSTIC FINDINGS: n/a  PATIENT SURVEYS:  LEFS 55  COGNITION: Overall cognitive status: Within functional limits for tasks assessed    EDEMA:  Hx of active lymphedema management in bilateral LE  MUSCLE LENGTH: Limited calf  length: see AROM resting position below  POSTURE: weight shift left  PALPATION: Tenderness to plantar aspect of foot and dorsal region of talus   LOWER EXTREMITY ROM:  Active ROM Right eval Left eval  Hip flexion    Hip extension    Hip abduction    Hip adduction    Hip internal rotation    Hip external rotation    Knee flexion    Knee extension    Ankle dorsiflexion AROM: -12 PROM; -2 AROM 5 PROM 10  Ankle plantarflexion AROM: 5 degrees actively from neutral position AROM 30  Ankle inversion    Ankle eversion     (Blank rows = not tested)  Neutral position: 39 R foot   Trunk Flexion WFL  Trunk Extension WFL  Trunk R SB WFL  Trunk L SB WFL  Trunk R rotation WFL  Trunk L rotation WFL    LOWER EXTREMITY MMT: Test Next Session   MMT Right eval Left eval  Hip flexion    Hip extension    Hip abduction    Hip adduction    Hip internal rotation    Hip external rotation    Knee flexion    Knee extension    Ankle dorsiflexion    Ankle plantarflexion    Ankle inversion    Ankle eversion     (Blank rows = not tested)  LOWER EXTREMITY SPECIAL TESTS:  Ankle special test:  Eversion stress test: negative  Anterior drawer test :positive Talar tilt test: positive  FUNCTIONAL TESTS:  10 meter walk test: 8 seconds with antalgic gait pattern  GAIT: Distance walked: 40 ft Assistive device utilized: None Level of assistance: SBA and CGA Comments: severe antalgic gait pattern with significant weight shift to LLE.                                                                                                                                 TREATMENT DATE: 08/21/23    Manual:  iaSTM to plantar surface of foot and achilles x 18 minutes with multiple areas of adhesions. STM to R anterior and posterior calf for tension reduction  Long sitting calcaneal distraction 3x30 seconds Long sitting calcaneal medial/lateral mobilization: multiple repetitions Talocrural  AP: x multiple repetitions, 30 sec bouts Fan metatarsal mobilizations, Grades II-III, for improved ROM of the intrinsic foot musculature, 30 sec bouts spent each segment  Neuro Re-ed: airex pad - static stand 60 seconds -single leg stance 60 seconds -tandem stance 30 seconds x  2 trials -march 10x each LE 2 second holds  BTB  talocrural AP with chair support      PATIENT EDUCATION:  Education details: goals, POC, HEP  Person educated: Patient Education method: Explanation, Demonstration, Tactile cues, Verbal cues, and Handouts Education comprehension: verbalized understanding, returned demonstration, verbal cues required, tactile cues required, and needs further education  HOME EXERCISE PROGRAM: Access Code: [email or web address] URL: [email or web address] Date: 06/26/2023 Prepared by: Chaunice Obie  Exercises - Seated Great Toe Extension  - 1 x daily - 7 x weekly - 2 sets - 10 reps - 5 hold - Seated Calf Stretch with Strap  - 1 x daily - 7 x weekly - 2 sets - 2 reps - 30 hold - Seated Ankle Alphabet  - 1 x daily - 7 x weekly - 2 sets - 10 reps - 5 hold  ASSESSMENT:  CLINICAL IMPRESSION: Patient will have a few more sessions and then finish due to financial constraints. Patient is eager to progress her pain free mobility. Decreased mobility initially resolved with repeated manual.  Pt will continue to benefit from skilled therapy to address remaining deficits in order to improve overall QoL and return to PLOF.       OBJECTIVE IMPAIRMENTS: Abnormal gait, decreased activity tolerance, decreased coordination, decreased endurance, decreased mobility, difficulty walking, decreased ROM, decreased strength, hypomobility, increased edema, increased fascial restrictions, impaired perceived functional ability, increased muscle spasms, impaired flexibility, improper body mechanics, postural dysfunction, and pain.   ACTIVITY LIMITATIONS: carrying, lifting, sitting, standing, squatting,  stairs, transfers, hygiene/grooming, locomotion level, and caring for others  PARTICIPATION LIMITATIONS: meal prep, cleaning, laundry, driving, shopping, community activity, occupation, and yard work  PERSONAL FACTORS: Age, Behavior pattern, Past/current experiences, Profession, Time since onset of injury/illness/exacerbation, and 3+ comorbidities: stage 2 moderate rheumatoid arthritis, lymphedema, HTN, GERD  are also affecting patient's functional outcome.   REHAB POTENTIAL: Good  CLINICAL DECISION MAKING: Evolving/moderate complexity  EVALUATION COMPLEXITY: Moderate   GOALS: Goals reviewed with patient? Yes  SHORT TERM GOALS: Target date: 07/24/2023  Patient will be independent in home exercise program to improve strength/mobility for better functional independence with ADLs. Baseline:compliant Goal status: MET    LONG TERM GOALS: Target date: 10/09/2023  Patient will report a worst pain of 3/10 on VAS in plantar aspect of foot to improve tolerance with ADLs and reduced symptoms with activities.  Baseline: 3/5: 9/10 4/23:  7/10  Goal status: In Progress  2.  Patient will achieve >10 degrees of DF to improve foot clearance and positioning.  Baseline: -12 AROM 4/12: 1 degree of DF AROM Goal status: Partially Met  3.  Patient will return to gym program with no limitations to return to PLOF. Baseline:  3/5: unable to go to gym  4/23: return to gym; unable to perform elliptical for as long as she likes Goal status: Partially Met   4.  Patient will increase lower extremity functional scale to >70/80 to demonstrate improved functional mobility and increased tolerance with ADLs.  Baseline:  3/5: 55/80 4/23: 65/80 Goal status: Partially Met     PLAN:  PT FREQUENCY: 2x/week  PT DURATION: 8 weeks  PLANNED INTERVENTIONS: 97164- PT Re-evaluation, 97110-Therapeutic exercises, 97530- Therapeutic activity, 97112- Neuromuscular re-education, 97535- Self Care, 16109- Manual therapy,  [phone]- Gait training, [phone]- Orthotic Fit/training, [phone]- Canalith repositioning, [id number]- Splinting, G9562- Electrical stimulation (unattended), [id number]- Electrical stimulation (manual), [id number]- Vasopneumatic device, [id number]- Ultrasound, [id number]- Traction (mechanical), Patient/Family education, Balance training, Stair training, Taping, Dry Needling, Joint mobilization, Joint manipulation, Spinal mobilization,  Compression bandaging, Vestibular training, Visual/preceptual remediation/compensation, DME instructions, Cryotherapy, and Moist heat  PLAN FOR NEXT SESSION:  needle anterior calf, iastm to plantar surface of foot, mobilize anterior shift to neutral alignment, test MMT, ankle stabilizers   Mikiala Fugett  Brain Cahill, PT, DPT Physical Therapist - [facility]  Outpatient Physical Therapy- Main Campus [phone]    08/21/23, 7:59 AM

## 2023-08-21 ENCOUNTER — Ambulatory Visit

## 2023-08-21 DIAGNOSIS — R262 Difficulty in walking, not elsewhere classified: Secondary | ICD-10-CM

## 2023-08-21 DIAGNOSIS — M25671 Stiffness of right ankle, not elsewhere classified: Secondary | ICD-10-CM

## 2023-08-21 DIAGNOSIS — M25571 Pain in right ankle and joints of right foot: Secondary | ICD-10-CM

## 2023-08-26 NOTE — Therapy (Signed)
 OUTPATIENT PHYSICAL THERAPY LOWER EXTREMITY TREATMENT     Patient Name: Victoria Berry MRN: [id number] DOB:[birth year], 53 y.o., female Today's Date: 08/27/2023  END OF SESSION:  PT End of Session - 08/27/23 1528     Visit Number 13    Number of Visits 26    Date for PT Re-Evaluation 10/09/23    PT Start Time 1530    PT Stop Time 1614    PT Time Calculation (min) 44 min    Activity Tolerance Patient tolerated treatment well;Patient limited by pain    Behavior During Therapy [facility] for tasks assessed/performed                       Past Medical History:  Diagnosis Date   GERD (gastroesophageal reflux disease)    Hypertension    Obesity    RA (rheumatoid arthritis) (HCC)    Past Surgical History:  Procedure Laterality Date   CESAREAN SECTION  2000   twins   COLONOSCOPY WITH PROPOFOL  N/A 06/24/2015   Procedure: COLONOSCOPY WITH PROPOFOL ;  Surgeon: Luella Sager, MD;  Location: [facility] ENDOSCOPY;  Service: Endoscopy;  Laterality: N/A;   ESOPHAGOGASTRODUODENOSCOPY (EGD) WITH PROPOFOL  N/A 06/24/2015   Procedure: ESOPHAGOGASTRODUODENOSCOPY (EGD) WITH PROPOFOL ;  Surgeon: Luella Sager, MD;  Location: [facility] ENDOSCOPY;  Service: Endoscopy;  Laterality: N/A;   right ureter     TONSILLECTOMY     Patient Active Problem List   Diagnosis Date Noted   Kidney cyst, acquired 03/14/2022   Intermittent palpitations 06/03/2019   Pulsatile tinnitus, right ear 12/25/2017   Anxiety state 01/16/2017   Major depression, melancholic type 01/16/2017   Acquired equinus deformity of both feet 10/25/2016   Chronic pain of right ankle 04/25/2016   Morbid obesity with BMI of 45.0-49.9, adult (HCC) 12/21/2015   CRP elevated 07/20/2015   Encounter for long-term (current) use of high-risk medication 04/20/2015   Mild obesity 04/20/2015   Rheumatoid arthritis of multiple sites with negative rheumatoid factor (HCC) 04/20/2015   Macular degeneration, dry 02/12/2014   GERD  (gastroesophageal reflux disease) 11/27/2013    PCP: Alyn Judge  REFERRING PROVIDER: Edward Graff   REFERRING DIAG: Peroneal tendonitis   THERAPY DIAG:  Difficulty in walking, not elsewhere classified  Stiffness of right ankle, not elsewhere classified  Pain in right ankle and joints of right foot  Rationale for Evaluation and Treatment: Rehabilitation  ONSET DATE: 5 years ago   SUBJECTIVE:   SUBJECTIVE STATEMENT: Patient reports she was gardening and her ankle kept rolling.   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 Physician requests additional checking of hip and LE ROM.  PMH includes stage 2 moderate rheumatoid arthritis, lymphedema, HTN, GERD.  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 Has flair ups. 5 years ago noticed R foot arch falling. Walk with in toe when plantar surface hurts.  Has good history of dry needling   PAIN:  Are you having pain? Yes: NPRS scale: 9/10 Pain location: heel, plantar surfaces Pain description: stabbing  Aggravating factors: dressing Relieving factors: n/a, rest   Plantar surface: worst 9/10 Midfoot, arch and medial tendons: worst 7/10   PRECAUTIONS: None  RED FLAGS: None   WEIGHT BEARING RESTRICTIONS: No  FALLS:  Has patient fallen in last 6 months? No  LIVING ENVIRONMENT: Lives with: lives with their family Lives in: House/apartment Stairs: No   OCCUPATION: Armed forces operational officer   PLOF:  Independent  PATIENT GOALS: to go to the gym   NEXT MD VISIT: n/a  OBJECTIVE:  Note: Objective measures were completed at Evaluation unless otherwise noted.  DIAGNOSTIC FINDINGS: n/a  PATIENT SURVEYS:  LEFS 55  COGNITION: Overall cognitive status: Within functional limits for tasks assessed    EDEMA:  Hx of active lymphedema management in bilateral LE  MUSCLE LENGTH: Limited calf length: see AROM resting position  below  POSTURE: weight shift left  PALPATION: Tenderness to plantar aspect of foot and dorsal region of talus   LOWER EXTREMITY ROM:  Active ROM Right eval Left eval  Hip flexion    Hip extension    Hip abduction    Hip adduction    Hip internal rotation    Hip external rotation    Knee flexion    Knee extension    Ankle dorsiflexion AROM: -12 PROM; -2 AROM 5 PROM 10  Ankle plantarflexion AROM: 5 degrees actively from neutral position AROM 30  Ankle inversion    Ankle eversion     (Blank rows = not tested)  Neutral position: 39 R foot   Trunk Flexion WFL  Trunk Extension WFL  Trunk R SB WFL  Trunk L SB WFL  Trunk R rotation WFL  Trunk L rotation WFL    LOWER EXTREMITY MMT: Test Next Session   MMT Right eval Left eval  Hip flexion    Hip extension    Hip abduction    Hip adduction    Hip internal rotation    Hip external rotation    Knee flexion    Knee extension    Ankle dorsiflexion    Ankle plantarflexion    Ankle inversion    Ankle eversion     (Blank rows = not tested)  LOWER EXTREMITY SPECIAL TESTS:  Ankle special test:  Eversion stress test: negative  Anterior drawer test :positive Talar tilt test: positive  FUNCTIONAL TESTS:  10 meter walk test: 8 seconds with antalgic gait pattern  GAIT: Distance walked: 40 ft Assistive device utilized: None Level of assistance: SBA and CGA Comments: severe antalgic gait pattern with significant weight shift to LLE.                                                                                                                                 TREATMENT DATE: 08/27/23    Manual:  iaSTM to plantar surface of foot and achilles x 18 minutes with multiple areas of adhesions. STM to R anterior and posterior calf for tension reduction  Long sitting calcaneal distraction 3x30 seconds Long sitting calcaneal medial/lateral mobilization: multiple repetitions Talocrural AP: x multiple repetitions, 30 sec  bouts Fan metatarsal mobilizations, Grades II-III, for improved ROM of the intrinsic foot musculature, 30 sec bouts spent each segment  therex RTB long sit 4 way ankle 10x each direction  Seated RTB 4 way ankle HEP review  Access Code: [email or web address] URL: [email or web address] Date:  08/27/2023 Prepared by: Dyamon Sosinski  Exercises - Seated Eccentric Ankle Plantar Flexion with Resistance  - 1 x daily - 7 x weekly - 2 sets - 10 reps - 5 hold - Seated Ankle Eversion with Resistance  - 1 x daily - 7 x weekly - 2 sets - 10 reps - 5 hold - Seated Ankle Inversion with Anchored Resistance  - 1 x daily - 7 x weekly - 2 sets - 10 reps - 5 hold - Seated Ankle Inversion with Resistance and Legs Crossed  - 1 x daily - 7 x weekly - 2 sets - 10 reps - 5 hold - Seated Ankle Dorsiflexion with Resistance  - 1 x daily - 7 x weekly - 2 sets - 10 reps - 5 hold     PATIENT EDUCATION:  Education details: goals, POC, HEP  Person educated: Patient Education method: Explanation, Demonstration, Tactile cues, Verbal cues, and Handouts Education comprehension: verbalized understanding, returned demonstration, verbal cues required, tactile cues required, and needs further education  HOME EXERCISE PROGRAM: Access Code: [email or web address] URL: [email or web address] Date: 06/26/2023 Prepared by: Fannie Alomar  Exercises - Seated Great Toe Extension  - 1 x daily - 7 x weekly - 2 sets - 10 reps - 5 hold - Seated Calf Stretch with Strap  - 1 x daily - 7 x weekly - 2 sets - 2 reps - 30 hold - Seated Ankle Alphabet  - 1 x daily - 7 x weekly - 2 sets - 10 reps - 5 hold  Access Code: [email or web address] URL: [email or web address] Date: 08/27/2023 Prepared by: Quentin Strebel  Exercises - Seated Eccentric Ankle Plantar Flexion with Resistance  - 1 x daily - 7 x weekly - 2 sets - 10 reps - 5 hold - Seated Ankle Eversion with Resistance  - 1 x daily - 7 x weekly - 2 sets - 10 reps - 5 hold - Seated  Ankle Inversion with Anchored Resistance  - 1 x daily - 7 x weekly - 2 sets - 10 reps - 5 hold - Seated Ankle Inversion with Resistance and Legs Crossed  - 1 x daily - 7 x weekly - 2 sets - 10 reps - 5 hold - Seated Ankle Dorsiflexion with Resistance  - 1 x daily - 7 x weekly - 2 sets - 10 reps - 5 hold  ASSESSMENT:  CLINICAL IMPRESSION: Patient has increased swelling noted in bilateral LE's affecting ankle strength. Education on potential bracing and/or use of hiking boots in garden. She is highly motivated for program. Patient tolerated HEP progression and demonstrates understanding.  Pt will continue to benefit from skilled therapy to address remaining deficits in order to improve overall QoL and return to PLOF.       OBJECTIVE IMPAIRMENTS: Abnormal gait, decreased activity tolerance, decreased coordination, decreased endurance, decreased mobility, difficulty walking, decreased ROM, decreased strength, hypomobility, increased edema, increased fascial restrictions, impaired perceived functional ability, increased muscle spasms, impaired flexibility, improper body mechanics, postural dysfunction, and pain.   ACTIVITY LIMITATIONS: carrying, lifting, sitting, standing, squatting, stairs, transfers, hygiene/grooming, locomotion level, and caring for others  PARTICIPATION LIMITATIONS: meal prep, cleaning, laundry, driving, shopping, community activity, occupation, and yard work  PERSONAL FACTORS: Age, Behavior pattern, Past/current experiences, Profession, Time since onset of injury/illness/exacerbation, and 3+ comorbidities: stage 2 moderate rheumatoid arthritis, lymphedema, HTN, GERD  are also affecting patient's functional outcome.   REHAB POTENTIAL: Good  CLINICAL DECISION MAKING: Evolving/moderate complexity  EVALUATION COMPLEXITY: Moderate   GOALS: Goals reviewed with patient? Yes  SHORT TERM GOALS: Target date: 07/24/2023  Patient will be independent in home exercise program to  improve strength/mobility for better functional independence with ADLs. Baseline:compliant Goal status: MET    LONG TERM GOALS: Target date: 10/09/2023  Patient will report a worst pain of 3/10 on VAS in plantar aspect of foot to improve tolerance with ADLs and reduced symptoms with activities.  Baseline: 3/5: 9/10 4/23:  7/10  Goal status: In Progress  2.  Patient will achieve >10 degrees of DF to improve foot clearance and positioning.  Baseline: -12 AROM 4/12: 1 degree of DF AROM Goal status: Partially Met  3.  Patient will return to gym program with no limitations to return to PLOF. Baseline:  3/5: unable to go to gym  4/23: return to gym; unable to perform elliptical for as long as she likes Goal status: Partially Met   4.  Patient will increase lower extremity functional scale to >70/80 to demonstrate improved functional mobility and increased tolerance with ADLs.  Baseline:  3/5: 55/80 4/23: 65/80 Goal status: Partially Met     PLAN:  PT FREQUENCY: 2x/week  PT DURATION: 8 weeks  PLANNED INTERVENTIONS: 97164- PT Re-evaluation, 97110-Therapeutic exercises, 97530- Therapeutic activity, 97112- Neuromuscular re-education, 97535- Self Care, 40981- Manual therapy, [phone]- Gait training, [phone]- Orthotic Fit/training, [phone]- Canalith repositioning, [id number]- Splinting, M5784- Electrical stimulation (unattended), [phone]- Electrical stimulation (manual), 97016- Vasopneumatic device, [id number]- Ultrasound, 52841- Traction (mechanical), Patient/Family education, Balance training, Stair training, Taping, Dry Needling, Joint mobilization, Joint manipulation, Spinal mobilization, Compression bandaging, Vestibular training, Visual/preceptual remediation/compensation, DME instructions, Cryotherapy, and Moist heat  PLAN FOR NEXT SESSION:  discharge  Analee Montee  Brain Cahill, PT, DPT Physical Therapist - [facility]  Outpatient Physical Therapy- Main Campus [phone]     08/27/23, 4:22 PM

## 2023-08-27 ENCOUNTER — Ambulatory Visit

## 2023-08-27 DIAGNOSIS — R262 Difficulty in walking, not elsewhere classified: Secondary | ICD-10-CM

## 2023-08-27 DIAGNOSIS — M25671 Stiffness of right ankle, not elsewhere classified: Secondary | ICD-10-CM | POA: Diagnosis present

## 2023-08-27 DIAGNOSIS — M25571 Pain in right ankle and joints of right foot: Secondary | ICD-10-CM | POA: Diagnosis present

## 2023-08-29 ENCOUNTER — Encounter

## 2023-09-02 ENCOUNTER — Encounter

## 2023-09-03 ENCOUNTER — Encounter (INDEPENDENT_AMBULATORY_CARE_PROVIDER_SITE_OTHER): Payer: Self-pay

## 2023-09-03 NOTE — Therapy (Signed)
 OUTPATIENT PHYSICAL THERAPY LOWER EXTREMITY TREATMENT/ Discharge     Patient Name: Victoria Berry MRN: [id number] DOB:[birth year], 53 y.o., female Today's Date: 09/04/2023  END OF SESSION:  PT End of Session - 09/04/23 0715     Visit Number 14    Number of Visits 26    Date for PT Re-Evaluation 10/09/23    PT Start Time 0715    PT Stop Time 0759    PT Time Calculation (min) 44 min    Activity Tolerance Patient tolerated treatment well;Patient limited by pain    Behavior During Therapy [facility] for tasks assessed/performed                        Past Medical History:  Diagnosis Date   GERD (gastroesophageal reflux disease)    Hypertension    Obesity    RA (rheumatoid arthritis) (HCC)    Past Surgical History:  Procedure Laterality Date   CESAREAN SECTION  2000   twins   COLONOSCOPY WITH PROPOFOL  N/A 06/24/2015   Procedure: COLONOSCOPY WITH PROPOFOL ;  Surgeon: Luella Sager, MD;  Location: [facility] ENDOSCOPY;  Service: Endoscopy;  Laterality: N/A;   ESOPHAGOGASTRODUODENOSCOPY (EGD) WITH PROPOFOL  N/A 06/24/2015   Procedure: ESOPHAGOGASTRODUODENOSCOPY (EGD) WITH PROPOFOL ;  Surgeon: Luella Sager, MD;  Location: [facility] ENDOSCOPY;  Service: Endoscopy;  Laterality: N/A;   right ureter     TONSILLECTOMY     Patient Active Problem List   Diagnosis Date Noted   Kidney cyst, acquired 03/14/2022   Intermittent palpitations 06/03/2019   Pulsatile tinnitus, right ear 12/25/2017   Anxiety state 01/16/2017   Major depression, melancholic type 01/16/2017   Acquired equinus deformity of both feet 10/25/2016   Chronic pain of right ankle 04/25/2016   Morbid obesity with BMI of 45.0-49.9, adult (HCC) 12/21/2015   CRP elevated 07/20/2015   Encounter for long-term (current) use of high-risk medication 04/20/2015   Mild obesity 04/20/2015   Rheumatoid arthritis of multiple sites with negative rheumatoid factor (HCC) 04/20/2015   Macular degeneration, dry  02/12/2014   GERD (gastroesophageal reflux disease) 11/27/2013    PCP: Alyn Judge  REFERRING PROVIDER: Edward Graff   REFERRING DIAG: Peroneal tendonitis   THERAPY DIAG:  Difficulty in walking, not elsewhere classified  Stiffness of right ankle, not elsewhere classified  Rationale for Evaluation and Treatment: Rehabilitation  ONSET DATE: 5 years ago   SUBJECTIVE:   SUBJECTIVE STATEMENT: Today is patient's last session.   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 Physician requests additional checking of hip and LE ROM.  PMH includes stage 2 moderate rheumatoid arthritis, lymphedema, HTN, GERD.  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 Has flair ups. 5 years ago noticed R foot arch falling. Walk with in toe when plantar surface hurts.  Has good history of dry needling   PAIN:  Are you having pain? Yes: NPRS scale: 9/10 Pain location: heel, plantar surfaces Pain description: stabbing  Aggravating factors: dressing Relieving factors: n/a, rest   Plantar surface: worst 9/10 Midfoot, arch and medial tendons: worst 7/10   PRECAUTIONS: None  RED FLAGS: None   WEIGHT BEARING RESTRICTIONS: No  FALLS:  Has patient fallen in last 6 months? No  LIVING ENVIRONMENT: Lives with: lives with their family Lives in: House/apartment Stairs: No   OCCUPATION: Armed forces operational officer   PLOF: Independent  PATIENT GOALS: to go to the gym   NEXT MD  VISIT: n/a  OBJECTIVE:  Note: Objective measures were completed at Evaluation unless otherwise noted.  DIAGNOSTIC FINDINGS: n/a  PATIENT SURVEYS:  LEFS 55  COGNITION: Overall cognitive status: Within functional limits for tasks assessed    EDEMA:  Hx of active lymphedema management in bilateral LE  MUSCLE LENGTH: Limited calf length: see AROM resting position below  POSTURE: weight shift left  PALPATION: Tenderness to  plantar aspect of foot and dorsal region of talus   LOWER EXTREMITY ROM:  Active ROM Right eval Left eval  Hip flexion    Hip extension    Hip abduction    Hip adduction    Hip internal rotation    Hip external rotation    Knee flexion    Knee extension    Ankle dorsiflexion AROM: -12 PROM; -2 AROM 5 PROM 10  Ankle plantarflexion AROM: 5 degrees actively from neutral position AROM 30  Ankle inversion    Ankle eversion     (Blank rows = not tested)  Neutral position: 39 R foot   Trunk Flexion WFL  Trunk Extension WFL  Trunk R SB WFL  Trunk L SB WFL  Trunk R rotation WFL  Trunk L rotation WFL    LOWER EXTREMITY MMT: Test Next Session   MMT Right eval Left eval  Hip flexion    Hip extension    Hip abduction    Hip adduction    Hip internal rotation    Hip external rotation    Knee flexion    Knee extension    Ankle dorsiflexion    Ankle plantarflexion    Ankle inversion    Ankle eversion     (Blank rows = not tested)  LOWER EXTREMITY SPECIAL TESTS:  Ankle special test:  Eversion stress test: negative  Anterior drawer test :positive Talar tilt test: positive  FUNCTIONAL TESTS:  10 meter walk test: 8 seconds with antalgic gait pattern  GAIT: Distance walked: 40 ft Assistive device utilized: None Level of assistance: SBA and CGA Comments: severe antalgic gait pattern with significant weight shift to LLE.                                                                                                                                 TREATMENT DATE: 09/04/23  Education/Home program> -review all HEP and home aggravation techniques to reduce pain.   Manual:  iaSTM to plantar surface of foot and achilles with multiple areas of adhesions. Talocrural AP: x multiple repetitions, 30 sec bouts Fan metatarsal mobilizations, Grades II-III, for improved ROM of the intrinsic foot musculature, 30 sec bouts spent each segment  Neuro Re-ed: Airex pad: -static  stand 30 seconds -single leg stance 30 seconds each LE -tandem stance 30 seconds each LE -march 10x each LE     PATIENT EDUCATION:  Education details: goals, POC, HEP  Person educated: Patient Education method: Explanation, Demonstration, Tactile cues, Verbal cues, and Handouts Education comprehension:  verbalized understanding, returned demonstration, verbal cues required, tactile cues required, and needs further education  HOME EXERCISE PROGRAM: Access Code: [email or web address] URL: [email or web address] Date: 06/26/2023 Prepared by: Everette Mall  Exercises - Seated Great Toe Extension  - 1 x daily - 7 x weekly - 2 sets - 10 reps - 5 hold - Seated Calf Stretch with Strap  - 1 x daily - 7 x weekly - 2 sets - 2 reps - 30 hold - Seated Ankle Alphabet  - 1 x daily - 7 x weekly - 2 sets - 10 reps - 5 hold  Access Code: [email or web address] URL: [email or web address] Date: 08/27/2023 Prepared by: Gorman Safi  Exercises - Seated Eccentric Ankle Plantar Flexion with Resistance  - 1 x daily - 7 x weekly - 2 sets - 10 reps - 5 hold - Seated Ankle Eversion with Resistance  - 1 x daily - 7 x weekly - 2 sets - 10 reps - 5 hold - Seated Ankle Inversion with Anchored Resistance  - 1 x daily - 7 x weekly - 2 sets - 10 reps - 5 hold - Seated Ankle Inversion with Resistance and Legs Crossed  - 1 x daily - 7 x weekly - 2 sets - 10 reps - 5 hold - Seated Ankle Dorsiflexion with Resistance  - 1 x daily - 7 x weekly - 2 sets - 10 reps - 5 hold  ASSESSMENT:  CLINICAL IMPRESSION:   Patient to be discharged due to financial constraints.  Patient's lymphedema remains primary issue at this time, will be seeking referral to lymphedema therapist at this time. She will benefit from continued lymphedema control in addition to structural strengthening of bilateral foot and ankle. We will be happy to see patient gain in the future as needed.      OBJECTIVE IMPAIRMENTS: Abnormal gait, decreased  activity tolerance, decreased coordination, decreased endurance, decreased mobility, difficulty walking, decreased ROM, decreased strength, hypomobility, increased edema, increased fascial restrictions, impaired perceived functional ability, increased muscle spasms, impaired flexibility, improper body mechanics, postural dysfunction, and pain.   ACTIVITY LIMITATIONS: carrying, lifting, sitting, standing, squatting, stairs, transfers, hygiene/grooming, locomotion level, and caring for others  PARTICIPATION LIMITATIONS: meal prep, cleaning, laundry, driving, shopping, community activity, occupation, and yard work  PERSONAL FACTORS: Age, Behavior pattern, Past/current experiences, Profession, Time since onset of injury/illness/exacerbation, and 3+ comorbidities: stage 2 moderate rheumatoid arthritis, lymphedema, HTN, GERD are also affecting patient's functional outcome.   REHAB POTENTIAL: Good  CLINICAL DECISION MAKING: Evolving/moderate complexity  EVALUATION COMPLEXITY: Moderate   GOALS: Goals reviewed with patient? Yes  SHORT TERM GOALS: Target date: 07/24/2023  Patient will be independent in home exercise program to improve strength/mobility for better functional independence with ADLs. Baseline:compliant Goal status: MET    LONG TERM GOALS: Target date: 10/09/2023  Patient will report a worst pain of 3/10 on VAS in plantar aspect of foot to improve tolerance with ADLs and reduced symptoms with activities.  Baseline: 3/5: 9/10 4/23:  7/10  Goal status: In Progress  2.  Patient will achieve >10 degrees of DF to improve foot clearance and positioning.  Baseline: -12 AROM 4/12: 1 degree of DF AROM Goal status: Partially Met  3.  Patient will return to gym program with no limitations to return to PLOF. Baseline:  3/5: unable to go to gym  4/23: return to gym; unable to perform elliptical for as long as she likes Goal status: Partially Met   4.  Patient will increase lower extremity  functional scale to >  70/80 to demonstrate improved functional mobility and increased tolerance with ADLs.  Baseline:  3/5: 55/80 4/23: 65/80 Goal status: Partially Met     PLAN:  PT FREQUENCY: 2x/week  PT DURATION: 8 weeks  PLANNED INTERVENTIONS: 97164- PT Re-evaluation, 97110-Therapeutic exercises, 97530- Therapeutic activity, 97112- Neuromuscular re-education, 97535- Self Care, 16109- Manual therapy, [phone]- Gait training, [phone]- Orthotic Fit/training, [phone]- Canalith repositioning, [id number]- Splinting, G9562- Electrical stimulation (unattended), [phone]- Electrical stimulation (manual), 97016- Vasopneumatic device, [id number]- Ultrasound, 57846- Traction (mechanical), Patient/Family education, Balance training, Stair training, Taping, Dry Needling, Joint mobilization, Joint manipulation, Spinal mobilization, Compression bandaging, Vestibular training, Visual/preceptual remediation/compensation, DME instructions, Cryotherapy, and Moist heat  PLAN FOR NEXT SESSION:  discharge  K[place]ner Warrior  Brain Cahill, PT, DPT Physical Therapist - [facility]  Outpatient Physical Therapy- Main Campus [phone]    09/04/23, 7:59 AM

## 2023-09-04 ENCOUNTER — Ambulatory Visit

## 2023-09-04 ENCOUNTER — Other Ambulatory Visit (INDEPENDENT_AMBULATORY_CARE_PROVIDER_SITE_OTHER): Payer: Self-pay | Admitting: Nurse Practitioner

## 2023-09-04 DIAGNOSIS — R262 Difficulty in walking, not elsewhere classified: Secondary | ICD-10-CM | POA: Diagnosis not present

## 2023-09-04 DIAGNOSIS — I890 Lymphedema, not elsewhere classified: Secondary | ICD-10-CM

## 2023-09-04 DIAGNOSIS — M25671 Stiffness of right ankle, not elsewhere classified: Secondary | ICD-10-CM

## 2023-09-09 ENCOUNTER — Encounter

## 2023-09-10 ENCOUNTER — Encounter (INDEPENDENT_AMBULATORY_CARE_PROVIDER_SITE_OTHER): Payer: Self-pay

## 2023-09-11 ENCOUNTER — Encounter

## 2023-09-17 ENCOUNTER — Encounter

## 2023-09-19 ENCOUNTER — Encounter

## 2023-09-23 ENCOUNTER — Encounter

## 2023-09-25 ENCOUNTER — Encounter

## 2023-09-30 ENCOUNTER — Encounter

## 2023-10-02 ENCOUNTER — Encounter

## 2023-10-02 ENCOUNTER — Ambulatory Visit (INDEPENDENT_AMBULATORY_CARE_PROVIDER_SITE_OTHER): Admitting: Nurse Practitioner

## 2023-10-02 ENCOUNTER — Encounter (INDEPENDENT_AMBULATORY_CARE_PROVIDER_SITE_OTHER): Payer: Self-pay

## 2023-10-02 ENCOUNTER — Encounter (INDEPENDENT_AMBULATORY_CARE_PROVIDER_SITE_OTHER)

## 2023-10-09 ENCOUNTER — Ambulatory Visit: Attending: Nurse Practitioner | Admitting: Occupational Therapy

## 2023-10-09 ENCOUNTER — Encounter: Payer: Self-pay | Admitting: Occupational Therapy

## 2023-10-09 DIAGNOSIS — I890 Lymphedema, not elsewhere classified: Secondary | ICD-10-CM | POA: Diagnosis present

## 2023-10-09 NOTE — Therapy (Signed)
 OUTPATIENT OCCUPATIONAL THERAPY EVALUATION  LOWER EXTREMITY LYMPHEDEMA  Patient Name: Victoria Berry MRN: [id number] DOB:Jun 30, 1970, 53 y.o., female Today's Date: 10/21/2023  END OF SESSION:   OT End of Session - 10/21/23 0809     Visit Number 1    Number of Visits 36    Date for OT Re-Evaluation 01/07/24    OT Start Time 0105    OT Stop Time 0235    OT Time Calculation (min) 90 min    Activity Tolerance Patient tolerated treatment well;No increased pain    Behavior During Therapy WFL for tasks assessed/performed            Past Medical History:  Diagnosis Date   GERD (gastroesophageal reflux disease)    Hypertension    Obesity    RA (rheumatoid arthritis) (HCC)    Past Surgical History:  Procedure Laterality Date   CESAREAN SECTION  2000   twins   COLONOSCOPY WITH PROPOFOL  N/A 06/24/2015   Procedure: COLONOSCOPY WITH PROPOFOL ;  Surgeon: Donnice Vaughn Manes, MD;  Location: [facility] ENDOSCOPY;  Service: Endoscopy;  Laterality: N/A;   ESOPHAGOGASTRODUODENOSCOPY (EGD) WITH PROPOFOL  N/A 06/24/2015   Procedure: ESOPHAGOGASTRODUODENOSCOPY (EGD) WITH PROPOFOL ;  Surgeon: Donnice Vaughn Manes, MD;  Location: [facility] ENDOSCOPY;  Service: Endoscopy;  Laterality: N/A;   right ureter     TONSILLECTOMY     Patient Active Problem List   Diagnosis Date Noted   Kidney cyst, acquired 03/14/2022   Intermittent palpitations 06/03/2019   Pulsatile tinnitus, right ear 12/25/2017   Anxiety state 01/16/2017   Major depression, melancholic type 01/16/2017   Acquired equinus deformity of both feet 10/25/2016   Chronic pain of right ankle 04/25/2016   Morbid obesity with BMI of 45.0-49.9, adult (HCC) 12/21/2015   CRP elevated 07/20/2015   Encounter for long-term (current) use of high-risk medication 04/20/2015   Mild obesity 04/20/2015   Rheumatoid arthritis of multiple sites with negative rheumatoid factor (HCC) 04/20/2015   Macular degeneration, dry 02/12/2014   GERD  (gastroesophageal reflux disease) 11/27/2013    PCP: [name] Perfect, PA-C  REFERRING PROVIDER: Orvin Daring, NP  REFERRING DIAG: I89.0  THERAPY DIAG:  Lymphedema, not elsewhere classified  Rationale for Evaluation and Treatment: Rehabilitation  ONSET DATE: Lipedema onset at puberty (+ family hx on both sides)      Lymphedema onset pre menopause ~ 10 yrs ago (2015)  SUBJECTIVE:                                                                                                                                                                                           SUBJECTIVE STATEMENT: Victoria Berry is referred  to Occupational Therapy by Orvin Daring, NP for evaluation and treatment of BLE lymphedema. Pt states her legs have always been big, but swelling worsened with the onset of perimenopause.  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 Pt states she wears compression stockings, but they tend to roll at the top edge causing pain. Pt endorses family history on both her mother's and father's side of chronic leg swelling. Pt's goals for OT are to control leg swelling, reduce pain, and limit lymphedema progression.  PERTINENT HISTORY:  RA onset 2009 (+ family hx), Lipedema (reported), Obesity - induced obesity (BMI 37.9 today), varicose veins  PAIN:  Are you having pain? Yes: NPRS scale: 6/10 Pain location: knees distally, bilateral ankles Pain description: heaviness, fullness, fatigued Aggravating factors: extended standing and sitting-gravity dependent positioning Relieving factors: elevation, pumps (Biotab)  PRECAUTIONS: Other: LYMPHEDEMA PRECAUTIONS  WEIGHT BEARING RESTRICTIONS: No   FALLS:  Has patient fallen in last 6 months? No  LIVING ENVIRONMENT: Lives with: lives with their spouse Lives in: House/apartment Stairs: Yes; External: 4 steps; can reach both Has following equipment at home:  None  OCCUPATION: Dental hygenist-full time  [place]URE: beach  HAND DOMINANCE: right   PRIOR LEVEL OF FUNCTION: Independent  PATIENT GOALS:  1 Learn how to minimize swelling 2. Work on weight loss 3. Reduce limb volume 4. Reduce discomfort 5. Keep swelling from getrting worse  OBJECTIVE: Note: Objective measures were completed at Evaluation unless otherwise noted.  COGNITION:  Overall cognitive status: Within functional limits for tasks assessed   LYMPHEDEMA OBSERVATIONS / OTHER ASSESSMENTS:   POSTURE: WNL  LE ROM: WNL  BLE COMPARATIVE LIMB VOLUMETRICS TBA OT Rx 1  LANDMARK RIGHT   R LEG (A-D) N/A  R THIGH (E-G) ml  R FULL LIMB (A-G) ml  Limb Volume differential (LVD)  %  Volume change since initial %  Volume change overall V  (Blank rows = not tested)  LANDMARK LEFT   L LEG (A-D) N/A  L THIGH (E-G) ml  L FULL LIMB (A-G) ml  Limb Volume differential (LVD)  %  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   Skin  Description Hyper-Keratosis Peau d' Orange Shiny Tight Fibrotic/ Indurated Fatty Doughy Spongy/ boggy       x x x x   Skin dry Flaky WNL Macerated     x    Color Redness Varicosities Blanching Hemosiderin Stain Mottled        x   Odor Malodorous Yeast Fungal infection  WNL      x   Temperature Warm Cool wnl    x     Pitting Edema   1+ 2+ 3+ 4+ Non-pitting         x   Girth Symmetrical Asymmetrical                   Distribution   x  Lower quadrants bilaterally: toes to groin, abdomen, hips, and  buttocks    Stemmer Sign Positive Negative    x   Lymphorrhea History Of:  Present Absent     x    Wounds History Of Present Absent Venous Arterial Pressure Sheer     x        Signs of Infection Redness Warmth Erythema Acute Swelling Drainage Borders  Sensation Light Touch Deep pressure Hypersensitivty   In tact  Impaired Present In tact Absent Impaired   x  x  x     Nails WNL   Fungus nail dystrophy   x     Hair Growth Symmetrical Asymmetrical   x    Skin Creases Base of toes  Ankles   Base of Fingers knees       Abdominal pannus Thigh Lobules  Face/neck    x    x      LYMPHEDEMA LIFE IMPACT SCALE (LLIS):52.94% (The extent to which LE-related problems impacted your life last week)   PATIENT EDUCATION:  Education details:  Discussed differential diagnoses for various swelling disorders. Provided basic level education regarding lymphatic structure and function, etiology, onset patterns, stages of progression, and prevention to limit infection risk, worsening condition and further functional decline. Pt edu for aught interaction between blood circulatory system and lymphatic circulation.Discussed  impact of gravity and co-morbidities on lymphatic function. Outlined Complete Decongestive Therapy (CDT)  as standard of care and provided in depth information regarding 4 primary components of Intensive and Self Management Phases, including Manual Lymph Drainage (MLD), compression wrapping and garments, skin care, and therapeutic exercise. Dempsey discussion with re need for frequent attendance and high burden of care when caregiver is needed, impact of co morbidities. We discussed  the chronic, progressive nature of lymphedema and Importance of daily, ongoing LE self-care essential for limiting progression and infection risk.  Person educated: Patient  Education method: Explanation, Demonstration, and Handouts  LYMPHEDEMA SELF-CARE HOME PROGRAM: BLE lymphatic pumping there ex- 1 set of 10 each element, in order. Hold 5. 2 x daily 2. Daily, short stretch, thigh length, multilayer compression bandages during Intensive Phase CDT 3. Custom-made gradient compression garments and HOS devices are medically necessary because they are uniquely sized and shaped to fit the exact dimensions of the affected  extremities, and to provide appropriate medical grade, graduated compression essential for optimally managing chronic, progressive lymphedema. Multiple custom compression garments are needed to ensure proper hygiene to limit infection risk. Custom compression garments should be replaced q 3-6 months When worn consistently for optimal lipo-lymphedema self-management over time. HOS devices, medically necessary to limit fibrosis buildup in tisse, should be replaced q 2 years and PRN when worn out.   During self-Management Phase appropriate thigh high compression stockings paired with compression biker shorts (medical grade TBD) 3. Daily skin care with low ph lotion matching skin ph 4. Daily simple self MLD     ASSESSMENT:   CLINICAL IMPRESSION:  OBJECTIVE IMPAIRMENTS: decreased activity tolerance, decreased knowledge of condition, decreased knowledge of use of DME, decreased ROM, increased edema, obesity, pain, and chronic, progregressive BLEBLQ swelling and associate pain.   ACTIVITY LIMITATIONS: impaired functional mobility and ambulation ( sitting, standing, squatting, stairs, and bed mobility). Impaired basic and instrumental ADLs ( extended standing, walking and or sitting to prep food, cook, perform shopping, housekeeping, to drive long distances, to fit shoes and lower body clothing, limits sleep), to perform productive activities ( work duties, Proofreader others); Leg pain and swelling limits leisure pursuits  requiring extended sitting, standing and/ or walking; social participation in the community requiring extended standing, sittying and or walking; psychosocial domain: impacts clothing selection due to trying to disguidse swelling, limits body imagelimits intimacy w partner, contributes to depression  PERSONAL FACTORS: Time since onset of injury/illness/exacerbation and 1-3 comorbidities: RA, varicose veins, Lipedema,  are also affecting patient's functional outcome.   REHAB POTENTIAL:  Good  EVALUATION COMPLEXITY: Moderate  Goals reviewed with patient? Yes   SHORT TERM GOALS: Target date: 4th OT Rx visit    Pt will demonstrate understanding of lymphedema precautions and prevention strategies with modified independence using a printed reference to identify at least 5 precautions and discussing how s/he may implement them into daily life to reduce risk of progression with modified assistance Baseline: max a Goal status: INITIAL   2. Pt will be able to apply multilayer, knee length, compression wraps using gradient techniques to decrease limb volume, to limit infection risk, and to limit lymphedema progression.   Baseline: Dependent Goal status: INITIAL     LONG TERM GOALS: Target date: 08/05/22 (12 WEEKS)     Given this patient's Intake score of 52.94 % on the Lymphedema Life Impact Scale (LLIS), patient will experience a reduction of at least 10 points in the extent to which LE-related problems impact her daily life to improve functional performance and quality of life (QOL) Baseline:52.94% Goal status: INITIAL   2.  Given this patient's Intake score of TBA/100% on the functional outcomes FOTO tool, patient will experience an increase in function of 5 points to improve basic and instrumental ADLs performance, including lymphedema self-care. (TBA at first OT Rx visit) Baseline: max a Goal status: INITIAL   3.  With modified independence (extra time and assistive devices) Pt will be able to don and doff appropriate compression garments and/or devices to control BLE lymphedema and to limit progression.  Baseline: Dependent Goal status: INITIAL   4. Pt will achieve at least a 10% volume reductions bilaterally below the knees to return limb to more typical size and shape, to limit infection risk and LE progression, to decrease pain, to improve function, and to improve body image and QOL. Baseline: Dependent Goal status: INITIAL   5. Pt will achieve and sustain at least  85% attendance at OT sessions, and with compliance with all LE self-care home program components throughout CDT, including modified simple self-MLD, daily skin care and inspection, lymphatic pumping the ex and appropriate compression to limit lymphedema progression and to limit further functional decline. Baseline: Dependent Goal status: INITIAL    PLAN:   OT FREQUENCY: 2x/week   OT DURATION: 18 weeks and PRN   PLANNED INTERVENTIONS:  Complete Decongestive Therapy (CDT) , intensive and self-management phases: elevation, therapeutic exercise, manual lymphatic drainage (MLD), skin care, COMPRESSION BANDAGING AND GARMENTS) Therapeutic activity, Patient/Family education, Self Care, DME instructions  2. Pt should be fit with Custom-made gradient compression garments and HOS devices. These are medically necessary because they are uniquely sized and shaped to fit the exact dimensions of the affected extremities, and to provide appropriate medical grade, graduated compression essential for optimally managing chronic, progressive lymphedema. Multiple custom compression garments are needed to ensure proper hygiene to limit infection risk. Custom compression garments should be replaced q 3-6 months When worn consistently for optimal lipo-lymphedema self-management over time. HOS devices, medically necessary to limit fibrosis buildup in tissue, should be replaced q 2 years and PRN when worn out.  3. Replace basic sequential  vaso-pneumatic pump with advanced Flexitouch Plus sequential pneumatic compression device which includes a truncal component that mobilizes lip-lymphedema of the trunk, abdomen, hips and buttocks following lymphatic anatomy. The Flexitouch is medically necessary to mobilize lymphedema above the level of the groin nodes towards abdominal lymph nodes and the thoracic duct to reduce infection risk and decrease volume for optimal LE management over time at home.   PLAN  FOR NEXT SESSION:  BLE  comparative limb volumetrics Compression bandaing (one leg at a time) Pt edu re LE self care    Zebedee Dec, MS, OTR/L, CLT-LANA 10/21/23 8:11 AM

## 2023-10-11 ENCOUNTER — Encounter: Payer: Self-pay | Admitting: Occupational Therapy

## 2023-10-21 ENCOUNTER — Encounter: Payer: Self-pay | Admitting: Occupational Therapy

## 2023-10-21 NOTE — Addendum Note (Signed)
 Addended by: MYRIAM ZEBEDEE CROME on: 10/21/2023 09:30 AM   Modules accepted: Orders

## 2023-10-23 ENCOUNTER — Encounter: Payer: Self-pay | Admitting: Occupational Therapy

## 2023-10-23 ENCOUNTER — Ambulatory Visit: Attending: Nurse Practitioner | Admitting: Occupational Therapy

## 2023-10-23 ENCOUNTER — Other Ambulatory Visit: Payer: Self-pay | Admitting: Obstetrics and Gynecology

## 2023-10-23 DIAGNOSIS — I890 Lymphedema, not elsewhere classified: Secondary | ICD-10-CM | POA: Diagnosis present

## 2023-10-23 DIAGNOSIS — Z1231 Encounter for screening mammogram for malignant neoplasm of breast: Secondary | ICD-10-CM

## 2023-10-23 NOTE — Progress Notes (Signed)
 Rheumatology Follow Up Note  Chief Complaint  Patient presents with  . Rheumatoid arthritis of multiple sites with negative rheuma      Subjective:Medication Refill Pertinent negatives include no chest pain, coughing, fatigue, headaches, nausea, numbness, rash or weakness.   Victoria Berry is a 53 y.o. female is here today for follow up of rheumatoid arthritis stage 2 moderate. The patient's allergies, current medications, past family history, past medical history, past social history, past surgical history and problem list were reviewed and updated as appropriate.   She is taking the Plaquenil. She is tolerating this well. She has no knee pains or swelling. She is able to form a fist. She continues to work on weight loss. She has no swelling of the joints. She has right foot pain is followed by podiatry regarding this. She is up to date on her eye exam. She has no fever, infection, fatigue, dyspnea, nasal/oral ulcer or vision trouble.   Review of Systems:   Review of Systems  Constitutional:  Negative for fatigue.  HENT:  Negative for mouth sores and trouble swallowing.        Neg: Dry Mouth  Eyes:  Negative for redness.       Neg: Dry Eyes  Respiratory:  Negative for cough and shortness of breath.   Cardiovascular:  Negative for chest pain and leg swelling.  Gastrointestinal:  Negative for constipation, diarrhea and nausea.  Endocrine: Negative for cold intolerance and heat intolerance.  Genitourinary:  Negative for hematuria.  Musculoskeletal:        Per HPI  Skin:  Negative for color change and rash.  Neurological:  Negative for dizziness, weakness, numbness and headaches.  Hematological:  Does not bruise/bleed easily.  Psychiatric/Behavioral:  Negative for dysphoric mood and sleep disturbance. The patient is not nervous/anxious.   All other systems reviewed and are negative.  Objective:  Vitals:   10/23/23 0821  BP: (!) 142/80  Temp: 36.4 C (97.5 F)  TempSrc:  Temporal  Weight: 86.2 kg (190 lb 0.6 oz)  Height: 152.4 cm (5')  PainSc: 0-No pain   Length of Stiffness in am: none in the morning   GEN - Pleasant, No Apparent Distress, overweight   HEENT - normocephalic and atraumatic. Conjunctiva Clear. Neck - supple with no adenopathy or thyromegaly.   C spine with full range of motion. Heart - regular rate and rhythm, No murmurs/gallops/rub, Nml S1S2 Lungs - clear to auscultation in all fields. Extremities - there is no cyanosis or edema.  Neurological - alert and oriented.  Spine - no paraspinal tenderness; no L spine pain Skin - no rashes observed MSK - The following joints were examined bilaterally: Hands, Wrists, Elbows, Shoulders, Metatarsals, Ankles, Knees and Hips; they were normal apart from what is noted.   100% Fist Formation No Synovitis or tenderness of the joints  Both Knees with Crepitus No Dactylitis No Tender Point Gait fluent  Labs/Imaging Reviewed in EMR Normal CMP and CBC Vitamin D 51.2 Normal C3 and C4 ESR 45 Pos: AntiCCP 201.3; ANA 1:2560 Neg: RF, Sm, RNP, SSA, SSB Neg: Hep B and C, Quantiferon   CDAI is calculated as follows:  CDAI = SJC + TJC +PGA + EGA = 2 Where:  SJC = Swollen Joint Count TJC = Tender Joint Count PGA = Patient Global Assessment of Disease Activity EGA = Evaluator Global Assessment of Disease Activity  Interpretation <=2.8: Remission >2.8 and <=10: Low Disease Activity >10 and <=22: Moderate Disease Activity >22: High Disease Activity  Assessment and Plan   1. Rheumatoid arthritis, stage 2 moderate: Stable  -- Has tried methotrexate 15 mg weekly in the past without much benefit -- Continue Plaquenil 400 mg Daily  2. Osteoarthritis of the knees -- Continue weight loss  3. Long term use of high risk medication -- Plaquenil requires eye exam monitoring for drug toxicity; [facility] -- Labs at [facility]   Diagnoses and all orders for this visit:  Rheumatoid  arthritis of multiple sites with negative rheumatoid factor (CMS/HHS-HCC) -     CBC w/auto Differential (5 Part) -     Comprehensive Metabolic Panel (CMP) -     Sedimentation Rate-Automated  Encounter for long-term (current) use of high-risk medication -     CBC w/auto Differential (5 Part) -     Comprehensive Metabolic Panel (CMP)     Return in about 6 months (around 04/24/2024) for Routine Follow Up.   All new prescription medications, changes in current prescription dosages, and sample medications were discussed with the patient, including patient education, medication name, use, dosage, potential side effects, drug interactions, consequences of not using/taking, and special instructions.  Patient expressed understanding.  No barriers to adherence.   I appreciate the opportunity to participate in the care of Victoria Berry. Please do not hesitate to contact me with any questions or concerns that may arise in regards to the patient's rheumatologic disease.   I personally performed the service. (TP)  MAYUR LOREE BLANCH, MD

## 2023-10-23 NOTE — Therapy (Signed)
 OUTPATIENT OCCUPATIONAL THERAPY EVALUATION  LOWER EXTREMITY LYMPHEDEMA  Patient Name: Victoria Berry MRN: [id number] DOB:June 10, 1970, 53 y.o., female Today's Date: 10/23/2023  END OF SESSION:   OT End of Session - 10/23/23 1012     Visit Number 2    Number of Visits 36    Date for OT Re-Evaluation 01/07/24    OT Start Time 1010    OT Stop Time 1110    OT Time Calculation (min) 60 min    Activity Tolerance Patient tolerated treatment well;No increased pain    Behavior During Therapy WFL for tasks assessed/performed            Past Medical History:  Diagnosis Date   GERD (gastroesophageal reflux disease)    Hypertension    Obesity    RA (rheumatoid arthritis) (HCC)    Past Surgical History:  Procedure Laterality Date   CESAREAN SECTION  2000   twins   COLONOSCOPY WITH PROPOFOL  N/A 06/24/2015   Procedure: COLONOSCOPY WITH PROPOFOL ;  Surgeon: Donnice Vaughn Manes, MD;  Location: [facility] ENDOSCOPY;  Service: Endoscopy;  Laterality: N/A;   ESOPHAGOGASTRODUODENOSCOPY (EGD) WITH PROPOFOL  N/A 06/24/2015   Procedure: ESOPHAGOGASTRODUODENOSCOPY (EGD) WITH PROPOFOL ;  Surgeon: Donnice Vaughn Manes, MD;  Location: [facility] ENDOSCOPY;  Service: Endoscopy;  Laterality: N/A;   right ureter     TONSILLECTOMY     Patient Active Problem List   Diagnosis Date Noted   Kidney cyst, acquired 03/14/2022   Intermittent palpitations 06/03/2019   Pulsatile tinnitus, right ear 12/25/2017   Anxiety state 01/16/2017   Major depression, melancholic type 01/16/2017   Acquired equinus deformity of both feet 10/25/2016   Chronic pain of right ankle 04/25/2016   Morbid obesity with BMI of 45.0-49.9, adult (HCC) 12/21/2015   CRP elevated 07/20/2015   Encounter for long-term (current) use of high-risk medication 04/20/2015   Mild obesity 04/20/2015   Rheumatoid arthritis of multiple sites with negative rheumatoid factor (HCC) 04/20/2015   Macular degeneration, dry 02/12/2014   GERD (gastroesophageal  reflux disease) 11/27/2013    PCP: [name] Perfect, PA-C  REFERRING PROVIDER: Orvin Daring, NP  REFERRING DIAG: I89.0  THERAPY DIAG:  Lymphedema, not elsewhere classified  Rationale for Evaluation and Treatment: Rehabilitation  ONSET DATE: Lipedema onset at puberty (+ family hx on both sides)      Lymphedema onset pre menopause ~ 10 yrs ago (2015)  SUBJECTIVE:                                                                                                                                                                                           SUBJECTIVE STATEMENT: Victoria Berry presents to  OT for treatment of BLE lipo-lymphedema. Pt reports pain in her ankles and legs at 6/10. Pt has no new complaints.   PERTINENT HISTORY:  RA onset 2009 (+ family hx), Lipedema (reported), Obesity - induced obesity (BMI 37.9 today), varicose veins  PAIN:  Are you having pain? Yes: NPRS scale: 6/10 Pain location: knees distally, bilateral ankles Pain description: heaviness, fullness, fatigued Aggravating factors: extended standing and sitting-gravity dependent positioning Relieving factors: elevation, pumps (Biotab)  PRECAUTIONS: Other: LYMPHEDEMA PRECAUTIONS  WEIGHT BEARING RESTRICTIONS: No   FALLS:  Has patient fallen in last 6 months? No  LIVING ENVIRONMENT: Lives with: lives with their spouse Lives in: House/apartment Stairs: Yes; External: 4 steps; can reach both Has following equipment at home: None  OCCUPATION: Dental hygienist-full time  [place]: [place]  HAND DOMINANCE: right   PRIOR LEVEL OF FUNCTION: Independent  PATIENT GOALS:  1 Learn how to minimize swelling 2. Work on weight loss 3. Reduce limb volume 4. Reduce discomfort 5. Keep swelling from getting worse  OBJECTIVE: Note: Objective measures were completed at Evaluation unless otherwise noted.  COGNITION:  Overall cognitive status: Within functional limits for tasks assessed   LYMPHEDEMA  OBSERVATIONS / OTHER ASSESSMENTS:   POSTURE: WNL  LE ROM: WNL  BLE COMPARATIVE LIMB VOLUMETRICS  10/23/23 INITIAL  LANDMARK RIGHT  (dominant)  R LEG (A-D) 4045.1 ml  R THIGH (E-G) 7657.4 ml  R FULL LIMB (A-G) 11702.5 ml  Limb Volume differential (LVD)  %  Volume change since initial %  Volume change overall V  (Blank rows = not tested)  LANDMARK LEFT   L LEG (A-D) 4133.6 ml  L THIGH (E-G) 7492.7 ml  L FULL LIMB (A-G) 11676.2 ml  Limb Volume differential (LVD)  LEG Limb Volume Differential (LVD) measures 3.4%, L>R. THIGH LVD measures 2.2 %, R>L, and LVD for full limb measures 0.2%, R>L.   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   Skin  Description Hyper-Keratosis Peau d' Orange Shiny Tight Fibrotic/ Indurated Fatty Doughy Spongy/ boggy       x x x x   Skin dry Flaky WNL Macerated     x    Color Redness Varicosities Blanching Hemosiderin Stain Mottled        x   Odor Malodorous Yeast Fungal infection  WNL      x   Temperature Warm Cool wnl    x     Pitting Edema   1+ 2+ 3+ 4+ Non-pitting         x   Girth Symmetrical Asymmetrical                   Distribution   x  Lower quadrants bilaterally: toes to groin, abdomen, hips, and  buttocks    Stemmer Sign Positive Negative    x   Lymphorrhea History Of:  Present Absent     x    Wounds History Of Present Absent Venous Arterial Pressure Sheer     x        Signs of Infection Redness Warmth Erythema Acute Swelling Drainage Borders                    Sensation Light Touch Deep pressure Hypersensitivity   In tact Impaired Present In tact Absent Impaired   x  x  x     Nails WNL   Fungus  nail dystrophy   x     Hair Growth Symmetrical Asymmetrical   x    Skin Creases Base of toes  Ankles   Base of Fingers knees       Abdominal pannus Thigh Lobules  Face/neck    x    x      LYMPHEDEMA LIFE  IMPACT SCALE (LLIS):52.94% (The extent to which LE-related problems impacted your life last week)   PATIENT EDUCATION:  Education details:  Discussed differential diagnoses for various swelling disorders. Provided basic level education regarding lymphatic structure and function, etiology, onset patterns, stages of progression, and prevention to limit infection risk, worsening condition and further functional decline. Pt edu for aught interaction between blood circulatory system and lymphatic circulation.Discussed  impact of gravity and co-morbidities on lymphatic function. Outlined Complete Decongestive Therapy (CDT)  as standard of care and provided in depth information regarding 4 primary components of Intensive and Self Management Phases, including Manual Lymph Drainage (MLD), compression wrapping and garments, skin care, and therapeutic exercise. Dempsey discussion with re need for frequent attendance and high burden of care when caregiver is needed, impact of co morbidities. We discussed  the chronic, progressive nature of lymphedema and Importance of daily, ongoing LE self-care essential for limiting progression and infection risk.  Person educated: Patient  Education method: Explanation, Demonstration, and Handouts  LYMPHEDEMA SELF-CARE HOME PROGRAM: BLE lymphatic pumping there ex- 1 set of 10 each element, in order. Hold 5. 2 x daily 2. Daily, short stretch, thigh length, multilayer compression bandages during Intensive Phase CDT 3. Custom-made gradient compression garments and HOS devices are medically necessary because they are uniquely sized and shaped to fit the exact dimensions of the affected extremities, and to provide appropriate medical grade, graduated compression essential for optimally managing chronic, progressive lymphedema. Multiple custom compression garments are needed to ensure proper hygiene to limit infection risk. Custom compression garments should be replaced q 3-6 months  When worn consistently for optimal lipo-lymphedema self-management over time. HOS devices, medically necessary to limit fibrosis buildup in tissue, should be replaced q 2 years and PRN when worn out.   During self-Management Phase appropriate thigh high compression stockings paired with compression biker shorts (medical grade TBD) 3. Daily skin care with low ph lotion matching skin ph 4. Daily simple self MLD     ASSESSMENT:   CLINICAL IMPRESSION: Initial comparative limb volumetrics of BLEs reveal LEG Limb Volume Differential (LVD) measures 3.4%, L>R. THIGH LVD measures 2.2 %, R>L, and LVD for full limb measures 0.2%, R>L. These values confirm that , in keeping with Lipedema, LVD evidence limb volume symmetry. Remainder of session dedicated to Pt edu for LE self care, centered on compression garment fabric dynamics, recommendations  and options. Provided Flexitouch information including differenced in basic vs advanced sequential compression devices. Cont as per POC.  OBJECTIVE IMPAIRMENTS: decreased activity tolerance, decreased knowledge of condition, decreased knowledge of use of DME, decreased ROM, increased edema, obesity, pain, and chronic, progressive BLE BLQ swelling and associate pain.   ACTIVITY LIMITATIONS: impaired functional mobility and ambulation ( sitting, standing, squatting, stairs, and bed mobility). Impaired basic and instrumental ADLs ( extended standing, walking and or sitting to prep food, cook, perform shopping, housekeeping, to drive long distances, to fit shoes and lower body clothing, limits sleep), to perform productive activities ( work duties, caring others); Leg pain and swelling limits leisure pursuits  requiring extended sitting, standing and/ or walking; social participation in the community requiring extended standing, sitting and  or walking; psychosocial domain: impacts clothing selection due to trying to disguise swelling, limits body image limits intimacy w partner,  contributes to depression  PERSONAL FACTORS: Time since onset of injury/illness/exacerbation and 1-3 co morbidities: RA, varicose veins, Lipedema,  are also affecting patient's functional outcome.   REHAB POTENTIAL: Good  EVALUATION COMPLEXITY: Moderate  Goals reviewed with patient? Yes   SHORT TERM GOALS: Target date: 4th OT Rx visit    Pt will demonstrate understanding of lymphedema precautions and prevention strategies with modified independence using a printed reference to identify at least 5 precautions and discussing how s/he may implement them into daily life to reduce risk of progression with modified assistance Baseline: max a Goal status: INITIAL   2. Pt will be able to apply multilayer, knee length, compression wraps using gradient techniques to decrease limb volume, to limit infection risk, and to limit lymphedema progression.   Baseline: Dependent Goal status: INITIAL     LONG TERM GOALS: Target date: 08/05/22 (12 WEEKS)     Given this patient's Intake score of 52.94 % on the Lymphedema Life Impact Scale (LLIS), patient will experience a reduction of at least 10 points in the extent to which LE-related problems impact her daily life to improve functional performance and quality of life (QOL) Baseline:52.94% Goal status: INITIAL   2.  Given this patient's Intake score of TBA/100% on the functional outcomes FOTO tool, patient will experience an increase in function of 5 points to improve basic and instrumental ADLs performance, including lymphedema self-care. (TBA at first OT Rx visit) Baseline: max a Goal status: INITIAL   3.  With modified independence (extra time and assistive devices) Pt will be able to don and doff appropriate compression garments and/or devices to control BLE lymphedema and to limit progression.  Baseline: Dependent Goal status: INITIAL   4. Pt will achieve at least a 10% volume reductions bilaterally below the knees to return limb to more typical  size and shape, to limit infection risk and LE progression, to decrease pain, to improve function, and to improve body image and QOL. Baseline: Dependent Goal status: INITIAL   5. Pt will achieve and sustain at least 85% attendance at OT sessions, and with compliance with all LE self-care home program components throughout CDT, including modified simple self-MLD, daily skin care and inspection, lymphatic pumping the ex and appropriate compression to limit lymphedema progression and to limit further functional decline. Baseline: Dependent Goal status: INITIAL    PLAN:   OT FREQUENCY: 2 x/week   OT DURATION: 18 weeks and PRN   PLANNED INTERVENTIONS:  Complete Decongestive Therapy (CDT) , intensive and self-management phases: elevation, therapeutic exercise, manual lymphatic drainage (MLD), skin care, COMPRESSION BANDAGING AND GARMENTS) Therapeutic activity, Patient/Family education, Self Care, DME instructions  2. Pt should be fit with Custom-made gradient compression garments and HOS devices. These are medically necessary because they are uniquely sized and shaped to fit the exact dimensions of the affected extremities, and to provide appropriate medical grade, graduated compression essential for optimally managing chronic, progressive lymphedema. Multiple custom compression garments are needed to ensure proper hygiene to limit infection risk. Custom compression garments should be replaced q 3-6 months When worn consistently for optimal lipo-lymphedema self-management over time. HOS devices, medically necessary to limit fibrosis buildup in tissue, should be replaced q 2 years and PRN when worn out.  3. Replace basic sequential  vaso-pneumatic pump with advanced Flexitouch Plus sequential pneumatic compression device which includes a truncal component that mobilizes  lip-lymphedema of the trunk, abdomen, hips and buttocks following lymphatic anatomy. The Flexitouch is medically necessary to mobilize  lymphedema above the level of the groin nodes towards abdominal lymph nodes and the thoracic duct to reduce infection risk and decrease volume for optimal LE management over time at home.   PLAN FOR NEXT SESSION:  BLE comparative limb volumetrics Compression bandaging (one leg at a time) Pt edu re LE self care    Zebedee Dec, MS, OTR/L, CLT-LANA 10/23/23 2:41 PM

## 2023-10-24 ENCOUNTER — Encounter: Payer: Self-pay | Admitting: Occupational Therapy

## 2023-10-24 ENCOUNTER — Ambulatory Visit: Admitting: Podiatry

## 2023-10-24 ENCOUNTER — Ambulatory Visit: Admitting: Occupational Therapy

## 2023-10-24 DIAGNOSIS — M722 Plantar fascial fibromatosis: Secondary | ICD-10-CM | POA: Diagnosis not present

## 2023-10-24 DIAGNOSIS — M19071 Primary osteoarthritis, right ankle and foot: Secondary | ICD-10-CM | POA: Diagnosis not present

## 2023-10-24 DIAGNOSIS — I890 Lymphedema, not elsewhere classified: Secondary | ICD-10-CM | POA: Diagnosis not present

## 2023-10-24 DIAGNOSIS — M7671 Peroneal tendinitis, right leg: Secondary | ICD-10-CM

## 2023-10-24 MED ORDER — METHYLPREDNISOLONE 4 MG PO TBPK: ORAL_TABLET | ORAL | 0 refills | Status: DC

## 2023-10-24 NOTE — Patient Instructions (Signed)

## 2023-10-24 NOTE — Progress Notes (Signed)
 Subjective:  Patient ID: Victoria Berry, female    DOB: 02-04-1971,  MRN: [id number]  Chief Complaint  Patient presents with   Foot Pain    RM#13 Patient states here for second opinion has been seeing another podiatrist has been through physical therapy and just picked up orthotics yesterday treatment of right foot issues navicular bone area a great concern. Has history of plantar fasciitis doing better.    Discussed the use of AI scribe software for clinical note transcription with the patient, who gave verbal consent to proceed.  History of Present Illness Victoria Berry is a 53 year old female with rheumatoid arthritis who presents with chronic foot pain and plantar fasciitis.  She has experienced plantar fasciitis intermittently for over twenty years, with orthotics providing relief in the past. Symptoms recur without orthotics or with inappropriate footwear. A significant flare-up occurred five to six years ago, which she managed to control. About a year ago, rebounding exercises without shoes exacerbated her symptoms. Prolonged standing during her daughter's wedding in September further aggravated her condition, leading to persistent pain.  She deals with lipedema, which may contribute to connective tissue issues affecting her ligaments and tendons. Symptoms include aching in the heel and side of the heel, particularly after prolonged standing or sitting. She has difficulty walking normally after sitting for extended periods, requiring a few minutes to walk normally. She has been working on lifting her arch and recently received new orthotics.  Physical therapy  has alleviated some symptoms, but tenderness and pain persist, especially after prolonged standing. Her foot pain previously limited activities, but there has been some improvement. She has tried dry needling, which provided temporary relief, and uses lymphatic pumps at home. No numbness or tingling in her feet.  Limited ankle and big toe flexion may contribute to her foot issues.  She has gained approximately twenty pounds in the past year, which may have compounded her foot problems. She is actively working on losing weight. She uses exercises to strengthen her foot and arch, including toe crunches and arch lifts. Previous injection in her foot did not provide relief, and topical treatments like Voltaren gel have not been significantly effective. She uses a makeshift splint at night to maintain foot position.      Objective:    Physical Exam General: AAO x3, NAD  Dermatological: Skin is warm, dry and supple bilateral. There are no open sores, no preulcerative lesions, no rash or signs of infection present.  Vascular: Dorsalis Pedis artery and Posterior Tibial artery pedal pulses are 2/4 bilateral with immedate capillary fill time.  There is no pain with calf compression, swelling, warmth, erythema.   Neruologic: Grossly intact via light touch bilateral.  Negative tinel sign.   Musculoskeletal: Upon eversion of the foot there is some discomfort in the course of the peroneal tendons.  There is mild discomfort at the lateral aspect the foot on the peroneal tendon but no area pinpoint tenderness.  She gets some discomfort on the talonavicular joint as well and along the left midfoot plantarly.  There is no area pinpoint tenderness.  MMT 5/5.  No pain with Achilles tendon.  Flexor, extensor tendons appear to be intact.  Decreased range of motion of first MTPJ and dorsiflexion.        Results RADIOLOGY Foot X-ray: Degenerative changes in the talonavicular joint, more pronounced in the right foot (May 10, 2023)   Assessment:   1. Arthritis of right foot   2. Plantar fasciitis   3. Peroneal tendonitis,  right      Plan:  Patient was evaluated and treated and all questions answered.  Assessment and Plan Assessment & Plan Plantar fasciitis Chronic plantar fasciitis with intermittent flare-ups  exacerbated by prolonged standing and improper footwear. Prefers non-surgical options. - Continue use of orthotics.  She just picked this up yesterday. - Continue home exercises including toe yoga and arch strengthening. - Consider shockwave therapy for additional relief. - Use topical treatments like Voltaren gel. - Prescribe a short course of oral steroids (tapered dose).  Peroneal tendonitis Peroneal tendonitis likely secondary to altered gait and compensation due to plantar fasciitis and arthritis. Previous injection ineffective, suggesting possible bone-related issue. - Continue home exercises to strengthen the foot and ankle. - Consider shockwave therapy for tendonitis. - Monitor symptoms with current orthotic use. - Night splint dispensed to help stretch the plantar fascia. Static or dynamic ankle foot orthosis, including soft interface material, adjustable for fit, for positioning, may be used for minimal ambulation, prefabricated, off-the-shelf was dispensed on the billed date of service    Arthritis of talonavicular joint Significant arthritis in the talonavicular joint, more pronounced in the right foot, contributing to limited range of motion and altered gait. Potential for progression to bone-on-bone arthritis. - Continue use of orthotics to support foot structure. - Consider diagnostic joint injection to assess pain relief. - Monitor progression and consider MRI if symptoms worsen.  Hallux limitus Hallux limitus with limited dorsiflexion of the big toe due to arthritis and joint stiffness, contributing to plantar fasciitis and altered gait. - Continue exercises to improve toe and foot flexibility. - Consider toe yoga exercises to strengthen toe muscles. - Monitor improvement with orthotic use.    Return in about 6 weeks (around 12/05/2023).   Victoria Berry Fees DPM

## 2023-10-24 NOTE — Therapy (Signed)
 OUTPATIENT OCCUPATIONAL THERAPY EVALUATION  LOWER EXTREMITY LYMPHEDEMA  Patient Name: Victoria Berry MRN: [id number] DOB:1970-12-19, 53 y.o., female Today's Date: 10/24/2023  END OF SESSION:   OT End of Session - 10/24/23 1111     Visit Number 3    Number of Visits 36    Date for OT Re-Evaluation 01/07/24    OT Start Time 1105    OT Stop Time 1205    OT Time Calculation (min) 60 min    Activity Tolerance Patient tolerated treatment well;No increased pain    Behavior During Therapy WFL for tasks assessed/performed            Past Medical History:  Diagnosis Date   GERD (gastroesophageal reflux disease)    Hypertension    Obesity    RA (rheumatoid arthritis) (HCC)    Past Surgical History:  Procedure Laterality Date   CESAREAN SECTION  2000   twins   COLONOSCOPY WITH PROPOFOL  N/A 06/24/2015   Procedure: COLONOSCOPY WITH PROPOFOL ;  Surgeon: Donnice Vaughn Manes, MD;  Location: [facility] ENDOSCOPY;  Service: Endoscopy;  Laterality: N/A;   ESOPHAGOGASTRODUODENOSCOPY (EGD) WITH PROPOFOL  N/A 06/24/2015   Procedure: ESOPHAGOGASTRODUODENOSCOPY (EGD) WITH PROPOFOL ;  Surgeon: Donnice Vaughn Manes, MD;  Location: [facility] ENDOSCOPY;  Service: Endoscopy;  Laterality: N/A;   right ureter     TONSILLECTOMY     Patient Active Problem List   Diagnosis Date Noted   Kidney cyst, acquired 03/14/2022   Intermittent palpitations 06/03/2019   Pulsatile tinnitus, right ear 12/25/2017   Anxiety state 01/16/2017   Major depression, melancholic type 01/16/2017   Acquired equinus deformity of both feet 10/25/2016   Chronic pain of right ankle 04/25/2016   Morbid obesity with BMI of 45.0-49.9, adult (HCC) 12/21/2015   CRP elevated 07/20/2015   Encounter for long-term (current) use of high-risk medication 04/20/2015   Mild obesity 04/20/2015   Rheumatoid arthritis of multiple sites with negative rheumatoid factor (HCC) 04/20/2015   Macular degeneration, dry 02/12/2014   GERD (gastroesophageal  reflux disease) 11/27/2013    PCP: [name] Perfect, PA-C  REFERRING PROVIDER: Orvin Daring, NP  REFERRING DIAG: I89.0  THERAPY DIAG:  Lymphedema, not elsewhere classified  Rationale for Evaluation and Treatment: Rehabilitation  ONSET DATE: Lipedema onset at puberty (+ family hx on both sides)      Lymphedema onset pre menopause ~ 10 yrs ago (2015)  SUBJECTIVE:                                                                                                                                                                                           SUBJECTIVE STATEMENT: [name] presents to  OT for treatment of BLE lipo-lymphedema. Pt reports pain in her ankles and legs at 6/10. Pt has no new complaints.   PERTINENT HISTORY:  RA onset 2009 (+ family hx), Lipedema (reported), Obesity - induced obesity (BMI 37.9 today), varicose veins  PAIN:  Are you having pain? Yes: NPRS scale: 6/10 Pain location: knees distally, bilateral ankles Pain description: heaviness, fullness, fatigued Aggravating factors: extended standing and sitting-gravity dependent positioning Relieving factors: elevation, pumps (Biotab)  PRECAUTIONS: Other: LYMPHEDEMA PRECAUTIONS  WEIGHT BEARING RESTRICTIONS: No   FALLS:  Has patient fallen in last 6 months? No  LIVING ENVIRONMENT: Lives with: lives with their spouse Lives in: House/apartment Stairs: Yes; External: 4 steps; can reach both Has following equipment at home: None  OCCUPATION: Dental hygienist-full time  [place]: [place]  HAND DOMINANCE: right   PRIOR LEVEL OF FUNCTION: Independent  PATIENT GOALS:  1 Learn how to minimize swelling 2. Work on weight loss 3. Reduce limb volume 4. Reduce discomfort 5. Keep swelling from getting worse  OBJECTIVE: Note: Objective measures were completed at Evaluation unless otherwise noted.  COGNITION:  Overall cognitive status: Within functional limits for tasks assessed   LYMPHEDEMA  OBSERVATIONS / OTHER ASSESSMENTS:   POSTURE: WNL  LE ROM: WNL  BLE COMPARATIVE LIMB VOLUMETRICS  10/23/23 INITIAL  LANDMARK RIGHT  (dominant)  R LEG (A-D) 4045.1 ml  R THIGH (E-G) 7657.4 ml  R FULL LIMB (A-G) 11702.5 ml  Limb Volume differential (LVD)  %  Volume change since initial %  Volume change overall V  (Blank rows = not tested)  LANDMARK LEFT   L LEG (A-D) 4133.6 ml  L THIGH (E-G) 7492.7 ml  L FULL LIMB (A-G) 11676.2 ml  Limb Volume differential (LVD)  LEG Limb Volume Differential (LVD) measures 3.4%, L>R. THIGH LVD measures 2.2 %, R>L, and LVD for full limb measures 0.2%, R>L.   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   Skin  Description Hyper-Keratosis Peau d' Orange Shiny Tight Fibrotic/ Indurated Fatty Doughy Spongy/ boggy       x x x x   Skin dry Flaky WNL Macerated     x    Color Redness Varicosities Blanching Hemosiderin Stain Mottled        x   Odor Malodorous Yeast Fungal infection  WNL      x   Temperature Warm Cool wnl    x     Pitting Edema   1+ 2+ 3+ 4+ Non-pitting         x   Girth Symmetrical Asymmetrical                   Distribution   x  Lower quadrants bilaterally: toes to groin, abdomen, hips, and  buttocks    Stemmer Sign Positive Negative    x   Lymphorrhea History Of:  Present Absent     x    Wounds History Of Present Absent Venous Arterial Pressure Sheer     x        Signs of Infection Redness Warmth Erythema Acute Swelling Drainage Borders                    Sensation Light Touch Deep pressure Hypersensitivity   In tact Impaired Present In tact Absent Impaired   x  x  x     Nails WNL   Fungus  nail dystrophy   x     Hair Growth Symmetrical Asymmetrical   x    Skin Creases Base of toes  Ankles   Base of Fingers knees       Abdominal pannus Thigh Lobules  Face/neck    x    x      LYMPHEDEMA LIFE  IMPACT SCALE (LLIS):52.94% (The extent to which LE-related problems impacted your life last week)   PATIENT EDUCATION:  Education details:  Discussed differential diagnoses for various swelling disorders. Provided basic level education regarding lymphatic structure and function, etiology, onset patterns, stages of progression, and prevention to limit infection risk, worsening condition and further functional decline. Pt edu for aught interaction between blood circulatory system and lymphatic circulation.Discussed  impact of gravity and co-morbidities on lymphatic function. Outlined Complete Decongestive Therapy (CDT)  as standard of care and provided in depth information regarding 4 primary components of Intensive and Self Management Phases, including Manual Lymph Drainage (MLD), compression wrapping and garments, skin care, and therapeutic exercise. Dempsey discussion with re need for frequent attendance and high burden of care when caregiver is needed, impact of co morbidities. We discussed  the chronic, progressive nature of lymphedema and Importance of daily, ongoing LE self-care essential for limiting progression and infection risk.  Person educated: Patient  Education method: Explanation, Demonstration, and Handouts  LYMPHEDEMA SELF-CARE HOME PROGRAM: BLE lymphatic pumping there ex- 1 set of 10 each element, in order. Hold 5. 2 x daily 2. Daily, short stretch, thigh length, multilayer compression bandages during Intensive Phase CDT 3. Custom-made gradient compression garments and HOS devices are medically necessary because they are uniquely sized and shaped to fit the exact dimensions of the affected extremities, and to provide appropriate medical grade, graduated compression essential for optimally managing chronic, progressive lymphedema. Multiple custom compression garments are needed to ensure proper hygiene to limit infection risk. Custom compression garments should be replaced q 3-6 months  When worn consistently for optimal lipo-lymphedema self-management over time. HOS devices, medically necessary to limit fibrosis buildup in tissue, should be replaced q 2 years and PRN when worn out.   During self-Management Phase appropriate thigh high compression stockings paired with compression biker shorts (medical grade TBD) 3. Daily skin care with low ph lotion matching skin ph 4. Daily simple self MLD     ASSESSMENT:   CLINICAL IMPRESSION: Commenced RLE/RLQ MLD in supine utilizing functional inguinal watershed. Pt able to correctly perform short neck sequence and diaphragmatic breathing after skilled teaching to stimulate terminus and deep abdominal lymphatics. Pt tolerated MLD with no pain. Cont as per POC.  OBJECTIVE IMPAIRMENTS: decreased activity tolerance, decreased knowledge of condition, decreased knowledge of use of DME, decreased ROM, increased edema, obesity, pain, and chronic, progressive BLE BLQ swelling and associate pain.   ACTIVITY LIMITATIONS: impaired functional mobility and ambulation ( sitting, standing, squatting, stairs, and bed mobility). Impaired basic and instrumental ADLs ( extended standing, walking and or sitting to prep food, cook, perform shopping, housekeeping, to drive long distances, to fit shoes and lower body clothing, limits sleep), to perform productive activities ( work duties, caring others); Leg pain and swelling limits leisure pursuits  requiring extended sitting, standing and/ or walking; social participation in the community requiring extended standing, sitting and or walking; psychosocial domain: impacts clothing selection due to trying to disguise swelling, limits body image limits intimacy w partner, contributes to depression  PERSONAL FACTORS: Time since onset of injury/illness/exacerbation and 1-3 co morbidities: RA, varicose veins, Lipedema,  are also affecting patient's functional outcome.   REHAB POTENTIAL: Good  EVALUATION COMPLEXITY:  Moderate  Goals reviewed with patient? Yes   SHORT TERM GOALS: Target date: 4th OT Rx visit    Pt will demonstrate understanding of lymphedema precautions and prevention strategies with modified independence using a printed reference to identify at least 5 precautions and discussing how s/he may implement them into daily life to reduce risk of progression with modified assistance Baseline: max a Goal status: INITIAL   2. Pt will be able to apply multilayer, knee length, compression wraps using gradient techniques to decrease limb volume, to limit infection risk, and to limit lymphedema progression.   Baseline: Dependent Goal status: INITIAL     LONG TERM GOALS: Target date: 08/05/22 (12 WEEKS)     Given this patient's Intake score of 52.94 % on the Lymphedema Life Impact Scale (LLIS), patient will experience a reduction of at least 10 points in the extent to which LE-related problems impact her daily life to improve functional performance and quality of life (QOL) Baseline:52.94% Goal status: INITIAL   2.  Given this patient's Intake score of TBA/100% on the functional outcomes FOTO tool, patient will experience an increase in function of 5 points to improve basic and instrumental ADLs performance, including lymphedema self-care. (TBA at first OT Rx visit) Baseline: max a Goal status: INITIAL   3.  With modified independence (extra time and assistive devices) Pt will be able to don and doff appropriate compression garments and/or devices to control BLE lymphedema and to limit progression.  Baseline: Dependent Goal status: INITIAL   4. Pt will achieve at least a 10% volume reductions bilaterally below the knees to return limb to more typical size and shape, to limit infection risk and LE progression, to decrease pain, to improve function, and to improve body image and QOL. Baseline: Dependent Goal status: INITIAL   5. Pt will achieve and sustain at least 85% attendance at OT sessions,  and with compliance with all LE self-care home program components throughout CDT, including modified simple self-MLD, daily skin care and inspection, lymphatic pumping the ex and appropriate compression to limit lymphedema progression and to limit further functional decline. Baseline: Dependent Goal status: INITIAL    PLAN:   OT FREQUENCY: 2 x/week   OT DURATION: 18 weeks and PRN   PLANNED INTERVENTIONS:  Complete Decongestive Therapy (CDT) , intensive and self-management phases: elevation, therapeutic exercise, manual lymphatic drainage (MLD), skin care, COMPRESSION BANDAGING AND GARMENTS) Therapeutic activity, Patient/Family education, Self Care, DME instructions  2. Pt should be fit with Custom-made gradient compression garments and HOS devices. These are medically necessary because they are uniquely sized and shaped to fit the exact dimensions of the affected extremities, and to provide appropriate medical grade, graduated compression essential for optimally managing chronic, progressive lymphedema. Multiple custom compression garments are needed to ensure proper hygiene to limit infection risk. Custom compression garments should be replaced q 3-6 months When worn consistently for optimal lipo-lymphedema self-management over time. HOS devices, medically necessary to limit fibrosis buildup in tissue, should be replaced q 2 years and PRN when worn out.  3. Replace basic sequential  vaso-pneumatic pump with advanced Flexitouch Plus sequential pneumatic compression device which includes a truncal component that mobilizes lip-lymphedema of the trunk, abdomen, hips and buttocks following lymphatic anatomy. The Flexitouch is medically necessary to mobilize lymphedema above the level of the groin nodes towards abdominal lymph nodes and the thoracic duct to reduce infection risk and decrease  volume for optimal LE management over time at home.   PLAN FOR NEXT SESSION:  MLD Compression bandaging (one  leg at a time) Pt edu re LE self care    Zebedee Dec, MS, OTR/L, CLT-LANA 10/24/23 12:15 PM

## 2023-10-30 ENCOUNTER — Ambulatory Visit: Admitting: Occupational Therapy

## 2023-10-30 DIAGNOSIS — I890 Lymphedema, not elsewhere classified: Secondary | ICD-10-CM

## 2023-10-30 NOTE — Therapy (Signed)
 OUTPATIENT OCCUPATIONAL THERAPY EVALUATION  BILATERAL LOWER EXTREMITY/ BILATERAL LOWER QUADRANT LIPO- LYMPHEDEMA  Patient Name: Victoria Berry MRN: [id number] DOB:[birth year], 53 y.o., female Today's Date: 10/30/2023  END OF SESSION:   OT End of Session - 10/30/23 1013     Visit Number 4    Number of Visits 36    Date for OT Re-Evaluation 01/07/24    OT Start Time 1000    OT Stop Time 1100    OT Time Calculation (min) 60 min    Activity Tolerance Patient tolerated treatment well;No increased pain    Behavior During Therapy WFL for tasks assessed/performed            Past Medical History:  Diagnosis Date   GERD (gastroesophageal reflux disease)    Hypertension    Obesity    RA (rheumatoid arthritis) (HCC)    Past Surgical History:  Procedure Laterality Date   CESAREAN SECTION  2000   twins   COLONOSCOPY WITH PROPOFOL  N/A 06/24/2015   Procedure: COLONOSCOPY WITH PROPOFOL ;  Surgeon: Donnice Vaughn Manes, MD;  Location: [facility] ENDOSCOPY;  Service: Endoscopy;  Laterality: N/A;   ESOPHAGOGASTRODUODENOSCOPY (EGD) WITH PROPOFOL  N/A 06/24/2015   Procedure: ESOPHAGOGASTRODUODENOSCOPY (EGD) WITH PROPOFOL ;  Surgeon: Donnice Vaughn Manes, MD;  Location: [facility] ENDOSCOPY;  Service: Endoscopy;  Laterality: N/A;   right ureter     TONSILLECTOMY     Patient Active Problem List   Diagnosis Date Noted   Kidney cyst, acquired 03/14/2022   Intermittent palpitations 06/03/2019   Pulsatile tinnitus, right ear 12/25/2017   Anxiety state 01/16/2017   Major depression, melancholic type 01/16/2017   Acquired equinus deformity of both feet 10/25/2016   Chronic pain of right ankle 04/25/2016   Morbid obesity with BMI of 45.0-49.9, adult (HCC) 12/21/2015   CRP elevated 07/20/2015   Encounter for long-term (current) use of high-risk medication 04/20/2015   Mild obesity 04/20/2015   Rheumatoid arthritis of multiple sites with negative rheumatoid factor (HCC) 04/20/2015   Macular  degeneration, dry 02/12/2014   GERD (gastroesophageal reflux disease) 11/27/2013    PCP: [name] Perfect, PA-C  REFERRING PROVIDER: Orvin Daring, NP  REFERRING DIAG: I89.0  THERAPY DIAG:  Lymphedema, not elsewhere classified  Rationale for Evaluation and Treatment: Rehabilitation  ONSET DATE: Lipedema onset at puberty (+ family hx on both sides)      Lymphedema onset pre menopause ~ 10 yrs ago (2015)  SUBJECTIVE:                                                                                                                                                                                           SUBJECTIVE STATEMENT:  [name] presents to OT for treatment of BLE lipo-lymphedema. Pt reports pain in her ankles and legs at 6/10. Pt has no new complaints. Pt states her insurace is changing soon, so she and Tactile rep decided to postpone the Flexitouch Plus trial until August.  PERTINENT HISTORY:  RA onset 2009 (+ family hx), Lipedema (reported), Obesity - induced obesity (BMI 37.9 today), varicose veins  PAIN:  Are you having pain? Yes: NPRS scale: 6/10 Pain location: knees distally, bilateral ankles Pain description: heaviness, fullness, fatigued Aggravating factors: extended standing and sitting-gravity dependent positioning Relieving factors: elevation, pumps (Biotab)  PRECAUTIONS: Other: LYMPHEDEMA PRECAUTIONS  WEIGHT BEARING RESTRICTIONS: No   FALLS:  Has patient fallen in last 6 months? No  LIVING ENVIRONMENT: Lives with: lives with their spouse Lives in: House/apartment Stairs: Yes; External: 4 steps; can reach both Has following equipment at home: None  OCCUPATION: Dental hygienist-full time  [place]URE: beach  HAND DOMINANCE: right   PRIOR LEVEL OF FUNCTION: Independent  PATIENT GOALS:  1 Learn how to minimize swelling 2. Work on weight loss 3. Reduce limb volume 4. Reduce discomfort 5. Keep swelling from getting worse  OBJECTIVE: Note:  Objective measures were completed at Evaluation unless otherwise noted.  COGNITION:  Overall cognitive status: Within functional limits for tasks assessed   LYMPHEDEMA OBSERVATIONS / OTHER ASSESSMENTS:   POSTURE: WNL  LE ROM: WNL  BLE COMPARATIVE LIMB VOLUMETRICS  10/23/23 INITIAL  LANDMARK RIGHT  (dominant)  R LEG (A-D) 4045.1 ml  R THIGH (E-G) 7657.4 ml  R FULL LIMB (A-G) 11702.5 ml  Limb Volume differential (LVD)  %  Volume change since initial %  Volume change overall V  (Blank rows = not tested)  LANDMARK LEFT   L LEG (A-D) 4133.6 ml  L THIGH (E-G) 7492.7 ml  L FULL LIMB (A-G) 11676.2 ml  Limb Volume differential (LVD)  LEG Limb Volume Differential (LVD) measures 3.4%, L>R. THIGH LVD measures 2.2 %, R>L, and LVD for full limb measures 0.2%, R>L.   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   Skin  Description Hyper-Keratosis Peau d' Orange Shiny Tight Fibrotic/ Indurated Fatty Doughy Spongy/ boggy       x x x x   Skin dry Flaky WNL Macerated     x    Color Redness Varicosities Blanching Hemosiderin Stain Mottled        x   Odor Malodorous Yeast Fungal infection  WNL      x   Temperature Warm Cool wnl    x     Pitting Edema   1+ 2+ 3+ 4+ Non-pitting         x   Girth Symmetrical Asymmetrical                   Distribution   x  Lower quadrants bilaterally: toes to groin, abdomen, hips, and  buttocks    Stemmer Sign Positive Negative    x   Lymphorrhea History Of:  Present Absent     x    Wounds History Of Present Absent Venous Arterial Pressure Sheer     x        Signs of Infection Redness Warmth Erythema Acute Swelling Drainage Borders                    Sensation Light Touch Deep pressure Hypersensitivity  In tact Impaired Present In tact Absent Impaired   x  x  x     Nails WNL   Fungus nail dystrophy   x     Hair Growth  Symmetrical Asymmetrical   x    Skin Creases Base of toes  Ankles   Base of Fingers knees       Abdominal pannus Thigh Lobules  Face/neck    x    x      LYMPHEDEMA LIFE IMPACT SCALE (LLIS):52.94% (The extent to which LE-related problems impacted your life last week)   PATIENT EDUCATION:  Continued Pt/ CG edu for lymphedema self care home program throughout session. Topics include outcome of comparative limb volumetrics- starting limb volume differentials (LVDs), technology and gradient techniques used for short stretch, multilayer compression wrapping, simple self-MLD, therapeutic lymphatic pumping exercises, skin/nail care, LE precautions, compression garment recommendations and specifications, wear and care schedule and compression garment donning / doffing w assistive devices. Discussed progress towards all OT goals since commencing CDT. Discussed detrimental impact of obesity on lower and upper extremity lymphedema over time. Reviewed OT goals for lymphedema care with Pt and discussed progress to date.  All questions answered to the Pt's satisfaction. Good return. Person educated: Patient  Education method: Explanation, Demonstration, and Handouts Education comprehension: verbalized understanding, returned demonstration, verbal cues required, and needs further education   LYMPHEDEMA SELF-CARE HOME PROGRAM: BLE lymphatic pumping there ex- 1 set of 10 each element, in order. Hold 5. 2 x daily 2. Daily, short stretch, thigh length, multilayer compression bandages during Intensive Phase CDT 3. Custom-made gradient compression garments and HOS devices are medically necessary because they are uniquely sized and shaped to fit the exact dimensions of the affected extremities, and to provide appropriate medical grade, graduated compression essential for optimally managing chronic, progressive lymphedema. Multiple custom compression garments are needed to ensure proper hygiene to limit infection  risk. Custom compression garments should be replaced q 3-6 months When worn consistently for optimal lipo-lymphedema self-management over time. HOS devices, medically necessary to limit fibrosis buildup in tissue, should be replaced q 2 years and PRN when worn out.   During self-Management Phase appropriate thigh high compression stockings paired with compression biker shorts (medical grade TBD) 3. Daily skin care with low ph lotion matching skin ph 4. Daily simple self MLD     ASSESSMENT:   CLINICAL IMPRESSION: Continued MLD today to  LLE/LLQ in supine utilizing functional inguinal watershed. Pt able to correctly perform short neck sequence and diaphragmatic breathing at correct times during sequences with 1 verbal cue only. Continued teaching Pt lymphatic structure and function in relation to MLD. Excellent return. Pt tolerated MLD with no pain. Cont as per POC.  OBJECTIVE IMPAIRMENTS: decreased activity tolerance, decreased knowledge of condition, decreased knowledge of use of DME, decreased ROM, increased edema, obesity, pain, and chronic, progressive BLE BLQ swelling and associate pain.   ACTIVITY LIMITATIONS: impaired functional mobility and ambulation ( sitting, standing, squatting, stairs, and bed mobility). Impaired basic and instrumental ADLs ( extended standing, walking and or sitting to prep food, cook, perform shopping, housekeeping, to drive long distances, to fit shoes and lower body clothing, limits sleep), to perform productive activities ( work duties, caring others); Leg pain and swelling limits leisure pursuits  requiring extended sitting, standing and/ or walking; social participation in the community requiring extended standing, sitting and or walking; psychosocial domain: impacts clothing selection due to trying to disguise swelling, limits body image limits intimacy w partner,  contributes to depression  PERSONAL FACTORS: Time since onset of injury/illness/exacerbation and 1-3 co  morbidities: RA, varicose veins, Lipedema,  are also affecting patient's functional outcome.   REHAB POTENTIAL: Good  EVALUATION COMPLEXITY: Moderate  Goals reviewed with patient? Yes   SHORT TERM GOALS: Target date: 4th OT Rx visit    Pt will demonstrate understanding of lymphedema precautions and prevention strategies with modified independence using a printed reference to identify at least 5 precautions and discussing how s/he may implement them into daily life to reduce risk of progression with modified assistance Baseline: max a Goal status: INITIAL   2. Pt will be able to apply multilayer, knee length, compression wraps using gradient techniques to decrease limb volume, to limit infection risk, and to limit lymphedema progression.   Baseline: Dependent Goal status: INITIAL     LONG TERM GOALS: Target date: 08/05/22 (12 WEEKS)     Given this patient's Intake score of 52.94 % on the Lymphedema Life Impact Scale (LLIS), patient will experience a reduction of at least 10 points in the extent to which LE-related problems impact her daily life to improve functional performance and quality of life (QOL) Baseline:52.94% Goal status: INITIAL   2.  Given this patient's Intake score of TBA/100% on the functional outcomes FOTO tool, patient will experience an increase in function of 5 points to improve basic and instrumental ADLs performance, including lymphedema self-care. (TBA at first OT Rx visit) Baseline: max a Goal status: INITIAL   3.  With modified independence (extra time and assistive devices) Pt will be able to don and doff appropriate compression garments and/or devices to control BLE lymphedema and to limit progression.  Baseline: Dependent Goal status: INITIAL   4. Pt will achieve at least a 10% volume reductions bilaterally below the knees to return limb to more typical size and shape, to limit infection risk and LE progression, to decrease pain, to improve function, and to  improve body image and QOL. Baseline: Dependent Goal status: INITIAL   5. Pt will achieve and sustain at least 85% attendance at OT sessions, and with compliance with all LE self-care home program components throughout CDT, including modified simple self-MLD, daily skin care and inspection, lymphatic pumping the ex and appropriate compression to limit lymphedema progression and to limit further functional decline. Baseline: Dependent Goal status: INITIAL    PLAN:   OT FREQUENCY: 2 x/week   OT DURATION: 18 weeks and PRN   PLANNED INTERVENTIONS:  Complete Decongestive Therapy (CDT) , intensive and self-management phases: elevation, therapeutic exercise, manual lymphatic drainage (MLD), skin care, COMPRESSION BANDAGING AND GARMENTS) Therapeutic activity, Patient/Family education, Self Care, DME instructions  2. Pt should be fit with Custom-made gradient compression garments and HOS devices. These are medically necessary because they are uniquely sized and shaped to fit the exact dimensions of the affected extremities, and to provide appropriate medical grade, graduated compression essential for optimally managing chronic, progressive lymphedema. Multiple custom compression garments are needed to ensure proper hygiene to limit infection risk. Custom compression garments should be replaced q 3-6 months When worn consistently for optimal lipo-lymphedema self-management over time. HOS devices, medically necessary to limit fibrosis buildup in tissue, should be replaced q 2 years and PRN when worn out.  3. Replace basic sequential  vaso-pneumatic pump with advanced Flexitouch Plus sequential pneumatic compression device which includes a truncal component that mobilizes lip-lymphedema of the trunk, abdomen, hips and buttocks following lymphatic anatomy. The Flexitouch is medically necessary to mobilize lymphedema above  the level of the groin nodes towards abdominal lymph nodes and the thoracic duct to  reduce infection risk and decrease volume for optimal LE management over time at home.   PLAN FOR NEXT SESSION:  MLD Compression bandaging (one leg at a time) Pt edu re LE self care    Zebedee Dec, MS, OTR/L, CLT-LANA 10/30/23 11:11 AM

## 2023-11-12 NOTE — Progress Notes (Unsigned)
 PCP:  Watt [name] NOVAK, PA-C   No chief complaint on file.    HPI:      [name] is a 53 y.o. G1P0 who LMP was No LMP recorded. (Menstrual status: Irregular Periods)., presents today for her annual examination.  Her menses are infrequent, lasting 5 days, spotting only usually, occas has real flow. No BTB, no dysmen. Had AUB 3/23 with EM=15 mm on GYN u/s, neg EMB. Declined hormones at the time but is now taking Prometrium  100 mg at bedtime through [facility]. Pt states she has estrogen dominance. No vasomotor sx. Doing well.   Sex activity: single partner, contraception - condoms , declines other BC. No pain/bleeding; has occas dryness, improved with lubricants.  Last Pap: 02/22/21 Results were: no abnormalities /neg HPV DNA  Hx of STDs: hx of abn pap  Last mammogram: 08/10/22  Results were: normal--routine follow-up in 12 months There is no FH of breast cancer. There is no FH of ovarian cancer. The patient does do self-breast exams.   Tobacco use: The patient denies current or previous tobacco use. Alcohol use: social No drug use.  Exercise: mod active  Colonoscopy with Dr. Jeri at [facility] 2017 and 8/23; repeat due after 7 yrs now (was 5 ys).   She does get adequate calcium and Vitamin D in her diet.  Labs with PCP   Past Medical History:  Diagnosis Date   GERD (gastroesophageal reflux disease)    Hypertension    Obesity    RA (rheumatoid arthritis) (HCC)     Past Surgical History:  Procedure Laterality Date   CESAREAN SECTION  2000   twins   COLONOSCOPY WITH PROPOFOL  N/A 06/24/2015   Procedure: COLONOSCOPY WITH PROPOFOL ;  Surgeon: Donnice Vaughn Jeri, MD;  Location: [facility] ENDOSCOPY;  Service: Endoscopy;  Laterality: N/A;   ESOPHAGOGASTRODUODENOSCOPY (EGD) WITH PROPOFOL  N/A 06/24/2015   Procedure: ESOPHAGOGASTRODUODENOSCOPY (EGD) WITH PROPOFOL ;  Surgeon: Donnice Vaughn Jeri, MD;  Location: [facility] ENDOSCOPY;  Service: Endoscopy;  Laterality: N/A;   right  ureter     TONSILLECTOMY      Family History  Problem Relation Age of Onset   Rheum arthritis Mother    Lung cancer Paternal Grandfather    Lung cancer Father 26   Kidney cancer Maternal Grandmother 33   Breast cancer Neg Hx     Social History   Socioeconomic History   Marital status: Married    Spouse name: Not on file   Number of children: Not on file   Years of education: Not on file   Highest education level: Not on file  Occupational History   Not on file  Tobacco Use   Smoking status: Never   Smokeless tobacco: Never  Vaping Use   Vaping status: Never Used  Substance and Sexual Activity   Alcohol use: No   Drug use: No   Sexual activity: Yes    Birth control/protection: Condom  Other Topics Concern   Not on file  Social History Narrative   Not on file   Social Drivers of Health   Financial Resource Strain: Not on file  Food Insecurity: Not on file  Transportation Needs: Not on file  Physical Activity: Not on file  Stress: Not on file  Social Connections: Not on file  Intimate Partner Violence: Not on file    No outpatient medications have been marked as taking for the 11/14/23 encounter (Appointment) with Terrin Imparato B, PA-C.     ROS:  Review of Systems  Constitutional:  Negative for fatigue, fever and unexpected weight change.  Respiratory:  Negative for cough, shortness of breath and wheezing.   Cardiovascular:  Negative for chest pain, palpitations and leg swelling.  Gastrointestinal:  Negative for blood in stool, constipation, diarrhea, nausea and vomiting.  Endocrine: Negative for cold intolerance, heat intolerance and polyuria.  Genitourinary:  Negative for dyspareunia, dysuria, flank pain, frequency, genital sores, hematuria, menstrual problem, pelvic pain, urgency, vaginal bleeding, vaginal discharge and vaginal pain.  Musculoskeletal:  Negative for back pain, joint swelling and myalgias.  Skin:  Negative for rash.  Neurological:   Negative for dizziness, syncope, light-headedness, numbness and headaches.  Hematological:  Negative for adenopathy.  Psychiatric/Behavioral:  Negative for agitation, confusion, sleep disturbance and suicidal ideas. The patient is not nervous/anxious.      Objective: There were no vitals taken for this visit.  Physical Exam  Physical Exam Constitutional:      Appearance: She is well-developed.  Genitourinary:     Vulva normal.     Right Labia: No rash, tenderness or lesions.    Left Labia: No tenderness, lesions or rash.    No vaginal discharge, erythema or tenderness.      Right Adnexa: not tender and no mass present.    Left Adnexa: not tender and no mass present.    No cervical friability or polyp.     Uterus is not enlarged or tender.  Breasts:    Right: No mass, nipple discharge, skin change or tenderness.     Left: No mass, nipple discharge, skin change or tenderness.  Neck:     Thyroid: No thyromegaly.  Cardiovascular:     Rate and Rhythm: Normal rate and regular rhythm.     Heart sounds: Normal heart sounds. No murmur heard. Pulmonary:     Effort: Pulmonary effort is normal.     Breath sounds: Normal breath sounds.  Abdominal:     Palpations: Abdomen is soft.     Tenderness: There is no abdominal tenderness. There is no guarding or rebound.  Musculoskeletal:        General: Normal range of motion.     Cervical back: Normal range of motion.  Lymphadenopathy:     Cervical: No cervical adenopathy.  Neurological:     General: No focal deficit present.     Mental Status: She is alert and oriented to person, place, and time.     Cranial Nerves: No cranial nerve deficit.  Skin:    General: Skin is warm and dry.  Psychiatric:        Mood and Affect: Mood normal.        Behavior: Behavior normal.        Thought Content: Thought content normal.        Judgment: Judgment normal.  Vitals reviewed.     Assessment/Plan: Encounter for annual routine gynecological  examination  Encounter for screening mammogram for malignant neoplasm of breast; pt current on mammo  Abnormal uterine bleeding (AUB)--no recent sx; had neg EMB, on prog at bedtime. F/u prn.    GYN counsel breast self exam, mammography screening, adequate intake of calcium and vitamin D, diet and exercise     F/U  No follow-ups on file.  Domenique Southers B. Allanah Mcfarland, PA-C 11/12/2023 4:44 PM

## 2023-11-13 ENCOUNTER — Encounter

## 2023-11-13 ENCOUNTER — Ambulatory Visit: Admitting: Occupational Therapy

## 2023-11-13 DIAGNOSIS — I890 Lymphedema, not elsewhere classified: Secondary | ICD-10-CM | POA: Diagnosis not present

## 2023-11-13 NOTE — Therapy (Signed)
 OUTPATIENT OCCUPATIONAL THERAPY EVALUATION  BILATERAL LOWER EXTREMITY/ BILATERAL LOWER QUADRANT LIPO- LYMPHEDEMA  Patient Name: Victoria Berry MRN: [id number] DOB:1970/07/17, 53 y.o., female Today's Date: 11/13/2023  END OF SESSION:   OT End of Session - 11/13/23 0904     Visit Number 5    Number of Visits 36    Date for OT Re-Evaluation 01/07/24    OT Start Time 0904    Activity Tolerance Patient tolerated treatment well;No increased pain    Behavior During Therapy WFL for tasks assessed/performed            Past Medical History:  Diagnosis Date   GERD (gastroesophageal reflux disease)    Hypertension    Obesity    RA (rheumatoid arthritis) (HCC)    Past Surgical History:  Procedure Laterality Date   CESAREAN SECTION  2000   twins   COLONOSCOPY WITH PROPOFOL  N/A 06/24/2015   Procedure: COLONOSCOPY WITH PROPOFOL ;  Surgeon: Donnice Vaughn Manes, MD;  Location: [facility] ENDOSCOPY;  Service: Endoscopy;  Laterality: N/A;   ESOPHAGOGASTRODUODENOSCOPY (EGD) WITH PROPOFOL  N/A 06/24/2015   Procedure: ESOPHAGOGASTRODUODENOSCOPY (EGD) WITH PROPOFOL ;  Surgeon: Donnice Vaughn Manes, MD;  Location: [facility] ENDOSCOPY;  Service: Endoscopy;  Laterality: N/A;   right ureter     TONSILLECTOMY     Patient Active Problem List   Diagnosis Date Noted   Kidney cyst, acquired 03/14/2022   Intermittent palpitations 06/03/2019   Pulsatile tinnitus, right ear 12/25/2017   Anxiety state 01/16/2017   Major depression, melancholic type 01/16/2017   Acquired equinus deformity of both feet 10/25/2016   Chronic pain of right ankle 04/25/2016   Morbid obesity with BMI of 45.0-49.9, adult (HCC) 12/21/2015   CRP elevated 07/20/2015   Encounter for long-term (current) use of high-risk medication 04/20/2015   Mild obesity 04/20/2015   Rheumatoid arthritis of multiple sites with negative rheumatoid factor (HCC) 04/20/2015   Macular degeneration, dry 02/12/2014   GERD (gastroesophageal reflux disease)  11/27/2013    PCP: [name] Perfect, PA-C  REFERRING PROVIDER: Orvin Daring, NP  REFERRING DIAG: I89.0  THERAPY DIAG:  Lymphedema, not elsewhere classified  Rationale for Evaluation and Treatment: Rehabilitation  ONSET DATE: Lipedema onset at puberty (+ family hx on both sides)      Lymphedema onset pre menopause ~ 10 yrs ago (2015)  SUBJECTIVE:                                                                                                                                                                                           SUBJECTIVE STATEMENT: Victoria Berry presents to OT for treatment of BLE lipo-lymphedema. Pt reports pain, described as  heaviness, earlier this week.  Pt states her insurace is changing soon, so she and Tactile rep decided to postpone the Flexitouch Plus trial until August.  PERTINENT HISTORY:  RA onset 2009 (+ family hx), Lipedema (reported), Obesity - induced obesity (BMI 37.9 today), varicose veins  PAIN:  Are you having pain? Yes: NPRS scale: 6/10 Pain location: knees distally, bilateral ankles Pain description: heaviness, fullness, fatigued Aggravating factors: extended standing and sitting-gravity dependent positioning Relieving factors: elevation, pumps (Biotab)  PRECAUTIONS: Other: LYMPHEDEMA PRECAUTIONS  WEIGHT BEARING RESTRICTIONS: No   FALLS:  Has patient fallen in last 6 months? No  LIVING ENVIRONMENT: Lives with: lives with their spouse Lives in: House/apartment Stairs: Yes; External: 4 steps; can reach both Has following equipment at home: None  OCCUPATION: Dental hygienist-full time  [place]URE: beach  HAND DOMINANCE: right   PRIOR LEVEL OF FUNCTION: Independent  PATIENT GOALS:  1 Learn how to minimize swelling 2. Work on weight loss 3. Reduce limb volume 4. Reduce discomfort 5. Keep swelling from getting worse  OBJECTIVE: Note: Objective measures were completed at Evaluation unless otherwise  noted.  COGNITION:  Overall cognitive status: Within functional limits for tasks assessed   LYMPHEDEMA OBSERVATIONS / OTHER ASSESSMENTS:   POSTURE: WNL  LE ROM: WNL  BLE COMPARATIVE LIMB VOLUMETRICS  10/23/23 INITIAL  LANDMARK RIGHT  (dominant)  R LEG (A-D) 4045.1 ml  R THIGH (E-G) 7657.4 ml  R FULL LIMB (A-G) 11702.5 ml  Limb Volume differential (LVD)  %  Volume change since initial %  Volume change overall V  (Blank rows = not tested)  LANDMARK LEFT   L LEG (A-D) 4133.6 ml  L THIGH (E-G) 7492.7 ml  L FULL LIMB (A-G) 11676.2 ml  Limb Volume differential (LVD)  LEG Limb Volume Differential (LVD) measures 3.4%, L>R. THIGH LVD measures 2.2 %, R>L, and LVD for full limb measures 0.2%, R>L.   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   Skin  Description Hyper-Keratosis Peau d' Orange Shiny Tight Fibrotic/ Indurated Fatty Doughy Spongy/ boggy       x x x x   Skin dry Flaky WNL Macerated     x    Color Redness Varicosities Blanching Hemosiderin Stain Mottled        x   Odor Malodorous Yeast Fungal infection  WNL      x   Temperature Warm Cool wnl    x     Pitting Edema   1+ 2+ 3+ 4+ Non-pitting         x   Girth Symmetrical Asymmetrical                   Distribution   x  Lower quadrants bilaterally: toes to groin, abdomen, hips, and  buttocks    Stemmer Sign Positive Negative    x   Lymphorrhea History Of:  Present Absent     x    Wounds History Of Present Absent Venous Arterial Pressure Sheer     x        Signs of Infection Redness Warmth Erythema Acute Swelling Drainage Borders                    Sensation Light Touch Deep pressure Hypersensitivity   In tact Impaired Present In tact Absent Impaired   x  x  x     Nails  WNL   Fungus nail dystrophy   x     Hair Growth Symmetrical Asymmetrical   x    Skin Creases Base of toes  Ankles    Base of Fingers knees       Abdominal pannus Thigh Lobules  Face/neck    x    x      LYMPHEDEMA LIFE IMPACT SCALE (LLIS):52.94% (The extent to which LE-related problems impacted your life last week)   PATIENT EDUCATION:  Continued Pt/ CG edu for lymphedema self care home program throughout session. Topics include outcome of comparative limb volumetrics- starting limb volume differentials (LVDs), technology and gradient techniques used for short stretch, multilayer compression wrapping, simple self-MLD, therapeutic lymphatic pumping exercises, skin/nail care, LE precautions, compression garment recommendations and specifications, wear and care schedule and compression garment donning / doffing w assistive devices. Discussed progress towards all OT goals since commencing CDT. Discussed detrimental impact of obesity on lower and upper extremity lymphedema over time. Reviewed OT goals for lymphedema care with Pt and discussed progress to date.  All questions answered to the Pt's satisfaction. Good return. Person educated: Patient  Education method: Explanation, Demonstration, and Handouts Education comprehension: verbalized understanding, returned demonstration, verbal cues required, and needs further education   LYMPHEDEMA SELF-CARE HOME PROGRAM: BLE lymphatic pumping there ex- 1 set of 10 each element, in order. Hold 5. 2 x daily 2. Daily, short stretch, thigh length, multilayer compression bandages during Intensive Phase CDT 3. Custom-made gradient compression garments and HOS devices are medically necessary because they are uniquely sized and shaped to fit the exact dimensions of the affected extremities, and to provide appropriate medical grade, graduated compression essential for optimally managing chronic, progressive lymphedema. Multiple custom compression garments are needed to ensure proper hygiene to limit infection risk. Custom compression garments should be replaced q 3-6 months When  worn consistently for optimal lipo-lymphedema self-management over time. HOS devices, medically necessary to limit fibrosis buildup in tissue, should be replaced q 2 years and PRN when worn out.   During self-Management Phase appropriate thigh high compression stockings paired with compression biker shorts (medical grade TBD) 3. Daily skin care with low ph lotion matching skin ph 4. Daily simple self MLD     ASSESSMENT:   CLINICAL IMPRESSION: Continued MLD today to  LLE/LLQ in supine utilizing functional inguinal watershed. Pt able to correctly perform short neck sequence and diaphragmatic breathing at correct times during sequences with 1 verbal cue only. Continued teaching Pt lymphatic structure and function in relation to MLD. Excellent return. Pt tolerated MLD with no pain. Cont as per POC.  OBJECTIVE IMPAIRMENTS: decreased activity tolerance, decreased knowledge of condition, decreased knowledge of use of DME, decreased ROM, increased edema, obesity, pain, and chronic, progressive BLE BLQ swelling and associate pain.   ACTIVITY LIMITATIONS: impaired functional mobility and ambulation ( sitting, standing, squatting, stairs, and bed mobility). Impaired basic and instrumental ADLs ( extended standing, walking and or sitting to prep food, cook, perform shopping, housekeeping, to drive long distances, to fit shoes and lower body clothing, limits sleep), to perform productive activities ( work duties, caring others); Leg pain and swelling limits leisure pursuits  requiring extended sitting, standing and/ or walking; social participation in the community requiring extended standing, sitting and or walking; psychosocial domain: impacts clothing selection due to trying to disguise swelling, limits body image limits intimacy w partner, contributes to depression  PERSONAL FACTORS: Time since onset of injury/illness/exacerbation and 1-3 co morbidities: RA, varicose veins, Lipedema,  are also affecting  patient's functional outcome.   REHAB POTENTIAL: Good  EVALUATION COMPLEXITY: Moderate  Goals reviewed with patient? Yes   SHORT TERM GOALS: Target date: 4th OT Rx visit    Pt will demonstrate understanding of lymphedema precautions and prevention strategies with modified independence using a printed reference to identify at least 5 precautions and discussing how s/he may implement them into daily life to reduce risk of progression with modified assistance Baseline: max a Goal status: INITIAL   2. Pt will be able to apply multilayer, knee length, compression wraps using gradient techniques to decrease limb volume, to limit infection risk, and to limit lymphedema progression.   Baseline: Dependent Goal status: INITIAL     LONG TERM GOALS: Target date: 08/05/22 (12 WEEKS)     Given this patient's Intake score of 52.94 % on the Lymphedema Life Impact Scale (LLIS), patient will experience a reduction of at least 10 points in the extent to which LE-related problems impact her daily life to improve functional performance and quality of life (QOL) Baseline:52.94% Goal status: INITIAL   2.  Given this patient's Intake score of TBA/100% on the functional outcomes FOTO tool, patient will experience an increase in function of 5 points to improve basic and instrumental ADLs performance, including lymphedema self-care. (TBA at first OT Rx visit) Baseline: max a Goal status: INITIAL   3.  With modified independence (extra time and assistive devices) Pt will be able to don and doff appropriate compression garments and/or devices to control BLE lymphedema and to limit progression.  Baseline: Dependent Goal status: INITIAL   4. Pt will achieve at least a 10% volume reductions bilaterally below the knees to return limb to more typical size and shape, to limit infection risk and LE progression, to decrease pain, to improve function, and to improve body image and QOL. Baseline: Dependent Goal status:  INITIAL   5. Pt will achieve and sustain at least 85% attendance at OT sessions, and with compliance with all LE self-care home program components throughout CDT, including modified simple self-MLD, daily skin care and inspection, lymphatic pumping the ex and appropriate compression to limit lymphedema progression and to limit further functional decline. Baseline: Dependent Goal status: INITIAL    PLAN:   OT FREQUENCY: 2 x/week   OT DURATION: 18 weeks and PRN   PLANNED INTERVENTIONS:  Complete Decongestive Therapy (CDT) , intensive and self-management phases: elevation, therapeutic exercise, manual lymphatic drainage (MLD), skin care, COMPRESSION BANDAGING AND GARMENTS) Therapeutic activity, Patient/Family education, Self Care, DME instructions  2. Pt should be fit with Custom-made gradient compression garments and HOS devices. These are medically necessary because they are uniquely sized and shaped to fit the exact dimensions of the affected extremities, and to provide appropriate medical grade, graduated compression essential for optimally managing chronic, progressive lymphedema. Multiple custom compression garments are needed to ensure proper hygiene to limit infection risk. Custom compression garments should be replaced q 3-6 months When worn consistently for optimal lipo-lymphedema self-management over time. HOS devices, medically necessary to limit fibrosis buildup in tissue, should be replaced q 2 years and PRN when worn out.  3. Replace basic sequential  vaso-pneumatic pump with advanced Flexitouch Plus sequential pneumatic compression device which includes a truncal component that mobilizes lip-lymphedema of the trunk, abdomen, hips and buttocks following lymphatic anatomy. The Flexitouch is medically necessary to mobilize lymphedema above the level of the groin nodes towards abdominal lymph nodes and the thoracic duct to reduce infection risk and decrease  volume for optimal LE management  over time at home.   PLAN FOR NEXT SESSION:  MLD Compression bandaging (one leg at a time) Pt edu re LE self care    Zebedee Dec, MS, OTR/L, CLT-LANA 11/13/23 9:05 AM

## 2023-11-14 ENCOUNTER — Ambulatory Visit (INDEPENDENT_AMBULATORY_CARE_PROVIDER_SITE_OTHER): Admitting: Obstetrics and Gynecology

## 2023-11-14 ENCOUNTER — Encounter: Payer: Self-pay | Admitting: Obstetrics and Gynecology

## 2023-11-14 VITALS — BP 125/70 | HR 80 | Ht 60.0 in | Wt 195.0 lb

## 2023-11-14 DIAGNOSIS — Z1231 Encounter for screening mammogram for malignant neoplasm of breast: Secondary | ICD-10-CM

## 2023-11-14 DIAGNOSIS — N951 Menopausal and female climacteric states: Secondary | ICD-10-CM

## 2023-11-14 DIAGNOSIS — Z01411 Encounter for gynecological examination (general) (routine) with abnormal findings: Secondary | ICD-10-CM

## 2023-11-14 DIAGNOSIS — N939 Abnormal uterine and vaginal bleeding, unspecified: Secondary | ICD-10-CM | POA: Diagnosis not present

## 2023-11-14 DIAGNOSIS — Z01419 Encounter for gynecological examination (general) (routine) without abnormal findings: Secondary | ICD-10-CM

## 2023-11-14 MED ORDER — PROGESTERONE MICRONIZED 100 MG PO CAPS: 100.0000 mg | ORAL_CAPSULE | Freq: Every day | ORAL | 3 refills | Status: AC

## 2023-11-14 NOTE — Patient Instructions (Addendum)
  [facility] ([place])--[phone]

## 2023-11-27 ENCOUNTER — Ambulatory Visit: Admitting: Occupational Therapy

## 2023-12-05 ENCOUNTER — Encounter: Payer: Self-pay | Admitting: Podiatry

## 2023-12-05 ENCOUNTER — Ambulatory Visit: Payer: Self-pay | Admitting: Podiatry

## 2023-12-05 VITALS — Ht 60.0 in | Wt 195.0 lb

## 2023-12-05 DIAGNOSIS — M19071 Primary osteoarthritis, right ankle and foot: Secondary | ICD-10-CM

## 2023-12-05 DIAGNOSIS — M722 Plantar fascial fibromatosis: Secondary | ICD-10-CM

## 2023-12-05 DIAGNOSIS — M7671 Peroneal tendinitis, right leg: Secondary | ICD-10-CM | POA: Diagnosis not present

## 2023-12-05 NOTE — Progress Notes (Signed)
  Subjective:  Patient ID: Victoria Berry, female    DOB: 11-02-1970,  MRN: [id number] Chief Complaint  Patient presents with   Foot Pain    Pt is here to f/u on right foot due to arthritis, she states to notice some improvement, the night splint that was given at her last appointment helps a lot.     History of Present Illness Victoria Berry is a 53 year old female with rheumatoid arthritis who presents with chronic foot pain and plantar fasciitis.Overall she said that she does feel doing be she has tter.  She feels that she is walking through on her feet more.  The right foot she has asked me to give additional support.  She has been using the night splint which also helps.  She states the arch does not hurt.  No recent injuries or changes otherwise.   Objective:    Physical Exam General: AAO x3, NAD  Dermatological: Skin is warm, dry and supple bilateral. There are no open sores, no preulcerative lesions, no rash or signs of infection present.  Vascular: Dorsalis Pedis artery and Posterior Tibial artery pedal pulses are 2/4 bilateral with immedate capillary fill time.  There is no pain with calf compression, swelling, warmth, erythema.   Neruologic: Grossly intact via light touch bilateral.  Negative tinel sign.   Musculoskeletal: There is no significant tenderness to palpation along the course of the peroneal tendons.   She gets some discomfort on the talonavicular joint as well and along the left midfoot plantarly.  She does get some discomfort in the arch of the foot as well.  There is no area pinpoint tenderness at the MT 5/5.  Range of motion of the first MTPJ dorsiflexion.      Assessment:   1. Arthritis of right foot   2. Plantar fasciitis   3. Peroneal tendonitis, right      Plan:  Patient was evaluated and treated and all questions answered.  Assessment and Plan Assessment & Plan - I had her see Trish, pedorthist for modifications of her  inserts to help increase the arch on the right side. -Continue night splint, home stretching, rehab exercises on a regular basis. -Anti-inflammatories as needed.  Icing daily as well.  No follow-ups on file.  Donnice JONELLE Fees DPM

## 2023-12-06 ENCOUNTER — Ambulatory Visit: Admitting: Podiatry

## 2023-12-25 ENCOUNTER — Ambulatory Visit (INDEPENDENT_AMBULATORY_CARE_PROVIDER_SITE_OTHER): Admitting: Nurse Practitioner

## 2023-12-25 ENCOUNTER — Ambulatory Visit (INDEPENDENT_AMBULATORY_CARE_PROVIDER_SITE_OTHER)

## 2023-12-25 ENCOUNTER — Encounter (INDEPENDENT_AMBULATORY_CARE_PROVIDER_SITE_OTHER): Payer: Self-pay | Admitting: Nurse Practitioner

## 2023-12-25 VITALS — BP 129/83 | HR 66 | Ht 60.0 in | Wt 197.2 lb

## 2023-12-25 DIAGNOSIS — R599 Enlarged lymph nodes, unspecified: Secondary | ICD-10-CM

## 2023-12-25 DIAGNOSIS — I890 Lymphedema, not elsewhere classified: Secondary | ICD-10-CM

## 2024-01-05 NOTE — Progress Notes (Signed)
 Subjective:    Patient ID: Victoria Berry, female    DOB: 06-01-1970, 53 y.o.   MRN: [id number] Chief Complaint  Patient presents with   Follow-up    The patient returns to the office for followup evaluation regarding leg swelling.  The swelling has improved but she still continues to have struggles with maintaining the swelling especially as it goes on throughout the day.  She has been very diligent with wearing her compression socks daily in addition to getting lymphatic massages on a monthly basis.  She is also been diligent with other factors of conservative care such as elevation and activity and exercise.  She has also received a lymphedema pump and notes that it has been a very positive factor in her care.  There have not been any interval development of a ulcerations or wounds.  Previously swollen lymph nodes have not been noted today.  No evidence of DVT or superficial phlebitis noted bilaterally.  No evidence of deep venous insufficiency noted.       Review of Systems  Cardiovascular:  Positive for leg swelling.  Skin:  Negative for wound.  All other systems reviewed and are negative.      Objective:   Physical Exam Vitals reviewed.  HENT:     Head: Normocephalic.  Cardiovascular:     Rate and Rhythm: Normal rate.  Pulmonary:     Effort: Pulmonary effort is normal.  Musculoskeletal:     Right lower leg: Edema present.     Left lower leg: Edema present.  Skin:    General: Skin is warm and dry.  Neurological:     Mental Status: She is alert and oriented to person, place, and time.  Psychiatric:        Mood and Affect: Mood normal.        Behavior: Behavior normal.        Thought Content: Thought content normal.        Judgment: Judgment normal.     BP 129/83   Pulse 66   Ht 5' (1.524 m)   Wt 197 lb 4 oz (89.5 kg)   BMI 38.52 kg/m   Past Medical History:  Diagnosis Date   GERD (gastroesophageal reflux disease)    Lipidemia    Obesity     RA (rheumatoid arthritis) (HCC)     Social History   Socioeconomic History   Marital status: Married    Spouse name: Not on file   Number of children: Not on file   Years of education: Not on file   Highest education level: Not on file  Occupational History   Not on file  Tobacco Use   Smoking status: Never   Smokeless tobacco: Never  Vaping Use   Vaping status: Never Used  Substance and Sexual Activity   Alcohol use: No   Drug use: No   Sexual activity: Yes    Birth control/protection: Condom  Other Topics Concern   Not on file  Social History Narrative   Not on file   Social Drivers of Health   Financial Resource Strain: Not on file  Food Insecurity: Not on file  Transportation Needs: Not on file  Physical Activity: Not on file  Stress: Not on file  Social Connections: Not on file  Intimate Partner Violence: Not on file    Past Surgical History:  Procedure Laterality Date   CESAREAN SECTION  2000   twins   COLONOSCOPY WITH PROPOFOL  N/A 06/24/2015   Procedure: COLONOSCOPY  WITH PROPOFOL ;  Surgeon: Donnice Vaughn Manes, MD;  Location: [facility] ENDOSCOPY;  Service: Endoscopy;  Laterality: N/A;   ESOPHAGOGASTRODUODENOSCOPY (EGD) WITH PROPOFOL  N/A 06/24/2015   Procedure: ESOPHAGOGASTRODUODENOSCOPY (EGD) WITH PROPOFOL ;  Surgeon: Donnice Vaughn Manes, MD;  Location: [facility] ENDOSCOPY;  Service: Endoscopy;  Laterality: N/A;   right ureter     TONSILLECTOMY      Family History  Problem Relation Age of Onset   Rheum arthritis Mother    Lung cancer Paternal Grandfather    Lung cancer Father 32   Kidney cancer Maternal Grandmother 68   Breast cancer Neg Hx     Allergies  Allergen Reactions   Penicillins Nausea And Vomiting and Other (See Comments)    Had it as a child Had it as a child Had it as a child Had it as a child        No data to display            CMP  No results found for: NA, K, CL, CO2, GLUCOSE, BUN, CREATININE, CALCIUM, PROT,  ALBUMIN, AST, ALT, ALKPHOS, BILITOT, GFR, EGFR, GFRNONAA   No results found.     Assessment & Plan:   1. Lymphedema Recommend:  Recommend:  No surgery or intervention at this point in time.    I have reviewed my discussion with the patient regarding lymphedema and why it  causes symptoms.  Patient will continue wearing graduated compression on a daily basis. The patient should put the compression on first thing in the morning and removing them in the evening. The patient should not sleep in the compression.   In addition, behavioral modification throughout the day will be continued.  This will include frequent elevation (such as in a recliner), use of over the counter pain medications as needed and exercise such as walking.  The systemic causes for chronic edema such as liver, kidney and cardiac etiologies does not appear to have significant changed over the past year.    The patient will continue aggressive use of the  lymph pump.  This will continue to improve the edema control and prevent sequela such as ulcers and infections.     2. Enlarged lymph node Not noted on today's studies.  Likely result from multiple inflammatory processes such as rheumatoid arthritis.  Current Outpatient Medications on File Prior to Visit  Medication Sig Dispense Refill   cholecalciferol (VITAMIN D) 1000 UNITS tablet Take 1,000 Units by mouth daily.     hydroxychloroquine (PLAQUENIL) 200 MG tablet Take 200 mg by mouth 2 (two) times daily.     MAGNESIUM PO Take by mouth.     Multiple Vitamins-Minerals (PRESERVISION AREDS 2 PO) Take by mouth.     Omega-3 Fatty Acids (OMEGA-3 FISH OIL PO) Take by mouth.     progesterone  (PROMETRIUM ) 100 MG capsule Take 1 capsule (100 mg total) by mouth daily. 90 capsule 3   No current facility-administered medications on file prior to visit.    There are no Patient Instructions on file for this visit. No follow-ups on file.   Seira Cody E Sadarius Norman,  NP

## 2024-01-06 ENCOUNTER — Encounter (INDEPENDENT_AMBULATORY_CARE_PROVIDER_SITE_OTHER): Payer: Self-pay | Admitting: Nurse Practitioner

## 2024-01-08 ENCOUNTER — Ambulatory Visit: Admitting: Occupational Therapy

## 2024-01-15 ENCOUNTER — Encounter: Payer: Self-pay | Admitting: Occupational Therapy

## 2024-01-15 ENCOUNTER — Ambulatory Visit: Attending: Nurse Practitioner | Admitting: Occupational Therapy

## 2024-01-15 DIAGNOSIS — I890 Lymphedema, not elsewhere classified: Secondary | ICD-10-CM | POA: Insufficient documentation

## 2024-01-15 NOTE — Therapy (Unsigned)
 OUTPATIENT OCCUPATIONAL THERAPY TREATMENT  BILATERAL LOWER EXTREMITY/ BILATERAL LOWER QUADRANT LIPO- LYMPHEDEMA  Patient Name: Victoria Berry MRN: [id number] DOB:10/16/1970, 53 y.o., female Today's Date: 01/17/2024  END OF SESSION:   OT End of Session - 01/17/24 1407     Visit Number 6    Number of Visits 36    Date for Recertification  01/07/24    OT Start Time 0210    OT Stop Time 0304    OT Time Calculation (min) 54 min    Activity Tolerance Patient tolerated treatment well;No increased pain    Behavior During Therapy WFL for tasks assessed/performed            Past Medical History:  Diagnosis Date   GERD (gastroesophageal reflux disease)    Lipidemia    Obesity    RA (rheumatoid arthritis) (HCC)    Past Surgical History:  Procedure Laterality Date   CESAREAN SECTION  2000   twins   COLONOSCOPY WITH PROPOFOL  N/A 06/24/2015   Procedure: COLONOSCOPY WITH PROPOFOL ;  Surgeon: Donnice Vaughn Manes, MD;  Location: [facility] ENDOSCOPY;  Service: Endoscopy;  Laterality: N/A;   ESOPHAGOGASTRODUODENOSCOPY (EGD) WITH PROPOFOL  N/A 06/24/2015   Procedure: ESOPHAGOGASTRODUODENOSCOPY (EGD) WITH PROPOFOL ;  Surgeon: Donnice Vaughn Manes, MD;  Location: [facility] ENDOSCOPY;  Service: Endoscopy;  Laterality: N/A;   right ureter     TONSILLECTOMY     Patient Active Problem List   Diagnosis Date Noted   Kidney cyst, acquired 03/14/2022   Intermittent palpitations 06/03/2019   Pulsatile tinnitus, right ear 12/25/2017   Anxiety state 01/16/2017   Major depression, melancholic type 01/16/2017   Acquired equinus deformity of both feet 10/25/2016   Chronic pain of right ankle 04/25/2016   Morbid obesity with BMI of 45.0-49.9, adult (HCC) 12/21/2015   CRP elevated 07/20/2015   Encounter for long-term (current) use of high-risk medication 04/20/2015   Mild obesity 04/20/2015   Rheumatoid arthritis of multiple sites with negative rheumatoid factor (HCC) 04/20/2015   Macular degeneration,  dry 02/12/2014   GERD (gastroesophageal reflux disease) 11/27/2013    PCP: [name] Perfect, PA-C  REFERRING PROVIDER: Orvin Daring, NP  REFERRING DIAG: I89.0  THERAPY DIAG:  Lymphedema, not elsewhere classified  Rationale for Evaluation and Treatment: Rehabilitation  ONSET DATE: Lipedema onset at puberty (+ family hx on both sides)      Lymphedema onset pre menopause ~ 10 yrs ago (2015)  SUBJECTIVE:                                                                                                                                                                                           SUBJECTIVE STATEMENT:  Victoria Berry presents to OT for treatment of BLE lipo-lymphedema. Pt reports pain, described as heaviness, earlier this week. Pt was last seen on 01/07/24. She reports she continues to follow along with vascular specialist.  Pt states she has not yet contacted [employer] rep about Flexitouch, but is interested in exploring the advanced device option after the first of the year.   PERTINENT HISTORY:  RA onset 2009 (+ family hx), Lipedema (reported), Obesity - induced obesity (BMI 37.9 today), varicose veins  PAIN:  Are you having pain? Yes: NPRS scale: 6/10 Pain location: knees distally, bilateral ankles Pain description: heaviness, fullness, fatigued Aggravating factors: extended standing and sitting-gravity dependent positioning Relieving factors: elevation, pumps (Biotab)  PRECAUTIONS: Other: LYMPHEDEMA PRECAUTIONS  WEIGHT BEARING RESTRICTIONS: No   FALLS:  Has patient fallen in last 6 months? No  LIVING ENVIRONMENT: Lives with: lives with their spouse Lives in: House/apartment Stairs: Yes; External: 4 steps; can reach both Has following equipment at home: None  OCCUPATION: Dental hygienist-full time  [place]URE: [place]  HAND DOMINANCE: right   PRIOR LEVEL OF FUNCTION: Independent  PATIENT GOALS:  1 Learn how to minimize swelling 2. Work on weight loss 3.  Reduce limb volume 4. Reduce discomfort 5. Keep swelling from getting worse  OBJECTIVE: Note: Objective measures were completed at Evaluation unless otherwise noted.  COGNITION:  Overall cognitive status: Within functional limits for tasks assessed   LYMPHEDEMA OBSERVATIONS / OTHER ASSESSMENTS:   POSTURE: WNL  LE ROM: WNL  BLE COMPARATIVE LIMB VOLUMETRICS  10/23/23 INITIAL  LANDMARK RIGHT  (dominant)  R LEG (A-D) 4045.1 ml  R THIGH (E-G) 7657.4 ml  R FULL LIMB (A-G) 11702.5 ml  Limb Volume differential (LVD)  %  Volume change since initial %  Volume change overall V  (Blank rows = not tested)  LANDMARK LEFT   L LEG (A-D) 4133.6 ml  L THIGH (E-G) 7492.7 ml  L FULL LIMB (A-G) 11676.2 ml  Limb Volume differential (LVD)  LEG Limb Volume Differential (LVD) measures 3.4%, L>R. THIGH LVD measures 2.2 %, R>L, and LVD for full limb measures 0.2%, R>L.   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   Skin  Description Hyper-Keratosis Peau d' Orange Shiny Tight Fibrotic/ Indurated Fatty Doughy Spongy/ boggy       x x x x   Skin dry Flaky WNL Macerated     x    Color Redness Varicosities Blanching Hemosiderin Stain Mottled        x   Odor Malodorous Yeast Fungal infection  WNL      x   Temperature Warm Cool wnl    x     Pitting Edema   1+ 2+ 3+ 4+ Non-pitting         x   Girth Symmetrical Asymmetrical                   Distribution   x  Lower quadrants bilaterally: toes to groin, abdomen, hips, and  buttocks    Stemmer Sign Positive Negative    x   Lymphorrhea History Of:  Present Absent     x    Wounds History Of Present Absent Venous Arterial Pressure Sheer     x        Signs of Infection Redness Warmth Erythema Acute Swelling Drainage Borders  Sensation Light Touch Deep pressure Hypersensitivity   In tact Impaired Present In tact  Absent Impaired   x  x  x     Nails WNL   Fungus nail dystrophy   x     Hair Growth Symmetrical Asymmetrical   x    Skin Creases Base of toes  Ankles   Base of Fingers knees       Abdominal pannus Thigh Lobules  Face/neck    x    x      LYMPHEDEMA LIFE IMPACT SCALE (LLIS):52.94% (The extent to which LE-related problems impacted your life last week)   PATIENT EDUCATION:  Continued Pt/ CG edu for lymphedema self care home program throughout session. Topics include outcome of comparative limb volumetrics- starting limb volume differentials (LVDs), technology and gradient techniques used for short stretch, multilayer compression wrapping, simple self-MLD, therapeutic lymphatic pumping exercises, skin/nail care, LE precautions, compression garment recommendations and specifications, wear and care schedule and compression garment donning / doffing w assistive devices. Discussed progress towards all OT goals since commencing CDT. Discussed detrimental impact of obesity on lower and upper extremity lymphedema over time. Reviewed OT goals for lymphedema care with Pt and discussed progress to date.  All questions answered to the Pt's satisfaction. Good return. Person educated: Patient  Education method: Explanation, Demonstration, and Handouts Education comprehension: verbalized understanding, returned demonstration, verbal cues required, and needs further education   LYMPHEDEMA SELF-CARE HOME PROGRAM: BLE lymphatic pumping there ex- 1 set of 10 each element, in order. Hold 5. 2 x daily 2. Daily, short stretch, thigh length, multilayer compression bandages during Intensive Phase CDT 3. Custom-made gradient compression garments and HOS devices are medically necessary because they are uniquely sized and shaped to fit the exact dimensions of the affected extremities, and to provide appropriate medical grade, graduated compression essential for optimally managing chronic, progressive lymphedema.  Multiple custom compression garments are needed to ensure proper hygiene to limit infection risk. Custom compression garments should be replaced q 3-6 months When worn consistently for optimal lipo-lymphedema self-management over time. HOS devices, medically necessary to limit fibrosis buildup in tissue, should be replaced q 2 years and PRN when worn out.   During self-Management Phase appropriate thigh high compression stockings paired with compression biker shorts (medical grade TBD) 3. Daily skin care with low ph lotion matching skin ph 4. Daily simple self MLD     ASSESSMENT:   CLINICAL IMPRESSION: Continued MLD today to  LLE/LLQ in supine utilizing functional inguinal watershed. Pt able to correctly perform short neck sequence and diaphragmatic breathing at correct times during sequences with 1 verbal cue only. Continued teaching Pt lymphatic structure and function in relation to MLD. Excellent return. Pt tolerated MLD with no pain. Cont as per POC.  OBJECTIVE IMPAIRMENTS: decreased activity tolerance, decreased knowledge of condition, decreased knowledge of use of DME, decreased ROM, increased edema, obesity, pain, and chronic, progressive BLE BLQ swelling and associate pain.   ACTIVITY LIMITATIONS: impaired functional mobility and ambulation ( sitting, standing, squatting, stairs, and bed mobility). Impaired basic and instrumental ADLs ( extended standing, walking and or sitting to prep food, cook, perform shopping, housekeeping, to drive long distances, to fit shoes and lower body clothing, limits sleep), to perform productive activities ( work duties, caring others); Leg pain and swelling limits leisure pursuits  requiring extended sitting, standing and/ or walking; social participation in the community requiring extended standing, sitting and or walking; psychosocial domain: impacts clothing selection due to trying to disguise  swelling, limits body image limits intimacy w partner, contributes  to depression  PERSONAL FACTORS: Time since onset of injury/illness/exacerbation and 1-3 co morbidities: RA, varicose veins, Lipedema,  are also affecting patient's functional outcome.   REHAB POTENTIAL: Good  EVALUATION COMPLEXITY: Moderate  Goals reviewed with patient? Yes   SHORT TERM GOALS: Target date: 4th OT Rx visit    Pt will demonstrate understanding of lymphedema precautions and prevention strategies with modified independence using a printed reference to identify at least 5 precautions and discussing how s/he may implement them into daily life to reduce risk of progression with modified assistance Baseline: max a Goal status: INITIAL   2. Pt will be able to apply multilayer, knee length, compression wraps using gradient techniques to decrease limb volume, to limit infection risk, and to limit lymphedema progression.   Baseline: Dependent Goal status: INITIAL     LONG TERM GOALS: Target date: 08/05/22 (12 WEEKS)     Given this patient's Intake score of 52.94 % on the Lymphedema Life Impact Scale (LLIS), patient will experience a reduction of at least 10 points in the extent to which LE-related problems impact her daily life to improve functional performance and quality of life (QOL) Baseline:52.94% Goal status: INITIAL   2.  Given this patient's Intake score of TBA/100% on the functional outcomes FOTO tool, patient will experience an increase in function of 5 points to improve basic and instrumental ADLs performance, including lymphedema self-care. (TBA at first OT Rx visit) Baseline: max a Goal status: INITIAL   3.  With modified independence (extra time and assistive devices) Pt will be able to don and doff appropriate compression garments and/or devices to control BLE lymphedema and to limit progression.  Baseline: Dependent Goal status: INITIAL   4. Pt will achieve at least a 10% volume reductions bilaterally below the knees to return limb to more typical size and  shape, to limit infection risk and LE progression, to decrease pain, to improve function, and to improve body image and QOL. Baseline: Dependent Goal status: INITIAL   5. Pt will achieve and sustain at least 85% attendance at OT sessions, and with compliance with all LE self-care home program components throughout CDT, including modified simple self-MLD, daily skin care and inspection, lymphatic pumping the ex and appropriate compression to limit lymphedema progression and to limit further functional decline. Baseline: Dependent Goal status: INITIAL    PLAN:   OT FREQUENCY: 2 x/week   OT DURATION: 18 weeks and PRN   PLANNED INTERVENTIONS:  Complete Decongestive Therapy (CDT) , intensive and self-management phases: elevation, therapeutic exercise, manual lymphatic drainage (MLD), skin care, COMPRESSION BANDAGING AND GARMENTS) Therapeutic activity, Patient/Family education, Self Care, DME instructions  2. Pt should be fit with Custom-made gradient compression garments and HOS devices. These are medically necessary because they are uniquely sized and shaped to fit the exact dimensions of the affected extremities, and to provide appropriate medical grade, graduated compression essential for optimally managing chronic, progressive lymphedema. Multiple custom compression garments are needed to ensure proper hygiene to limit infection risk. Custom compression garments should be replaced q 3-6 months When worn consistently for optimal lipo-lymphedema self-management over time. HOS devices, medically necessary to limit fibrosis buildup in tissue, should be replaced q 2 years and PRN when worn out.  3. Replace basic sequential  vaso-pneumatic pump with advanced Flexitouch Plus sequential pneumatic compression device which includes a truncal component that mobilizes lip-lymphedema of the trunk, abdomen, hips and buttocks following lymphatic anatomy. The  Flexitouch is medically necessary to mobilize  lymphedema above the level of the groin nodes towards abdominal lymph nodes and the thoracic duct to reduce infection risk and decrease volume for optimal LE management over time at home.   PLAN FOR NEXT SESSION:  MLD Compression bandaging (one leg at a time) Pt edu re LE self care    Zebedee Dec, MS, OTR/L, CLT-LANA 01/17/24 2:08 PM

## 2024-01-29 ENCOUNTER — Encounter: Admitting: Occupational Therapy

## 2024-02-05 ENCOUNTER — Ambulatory Visit: Attending: Nurse Practitioner | Admitting: Occupational Therapy

## 2024-02-05 ENCOUNTER — Encounter: Payer: Self-pay | Admitting: Occupational Therapy

## 2024-02-05 DIAGNOSIS — I890 Lymphedema, not elsewhere classified: Secondary | ICD-10-CM | POA: Insufficient documentation

## 2024-02-05 NOTE — Therapy (Signed)
 OUTPATIENT OCCUPATIONAL THERAPY TREATMENT  BILATERAL LOWER EXTREMITY/ BILATERAL LOWER QUADRANT LIPO- LYMPHEDEMA  Patient Name: Victoria Berry MRN: [id number] DOB:1970-08-31, 53 y.o., female Today's Date: 02/05/2024  END OF SESSION:   OT End of Session - 02/05/24 1514     Visit Number 7    Number of Visits 36    Date for Recertification  01/07/24    OT Start Time 0305    OT Stop Time 0405    OT Time Calculation (min) 60 min    Activity Tolerance Patient tolerated treatment well;No increased pain    Behavior During Therapy WFL for tasks assessed/performed            Past Medical History:  Diagnosis Date   GERD (gastroesophageal reflux disease)    Lipidemia    Obesity    RA (rheumatoid arthritis) (HCC)    Past Surgical History:  Procedure Laterality Date   CESAREAN SECTION  2000   twins   COLONOSCOPY WITH PROPOFOL  N/A 06/24/2015   Procedure: COLONOSCOPY WITH PROPOFOL ;  Surgeon: Donnice Vaughn Manes, MD;  Location: [facility] ENDOSCOPY;  Service: Endoscopy;  Laterality: N/A;   ESOPHAGOGASTRODUODENOSCOPY (EGD) WITH PROPOFOL  N/A 06/24/2015   Procedure: ESOPHAGOGASTRODUODENOSCOPY (EGD) WITH PROPOFOL ;  Surgeon: Donnice Vaughn Manes, MD;  Location: [facility] ENDOSCOPY;  Service: Endoscopy;  Laterality: N/A;   right ureter     TONSILLECTOMY     Patient Active Problem List   Diagnosis Date Noted   Kidney cyst, acquired 03/14/2022   Intermittent palpitations 06/03/2019   Pulsatile tinnitus, right ear 12/25/2017   Anxiety state 01/16/2017   Major depression, melancholic type 01/16/2017   Acquired equinus deformity of both feet 10/25/2016   Chronic pain of right ankle 04/25/2016   Morbid obesity with BMI of 45.0-49.9, adult (HCC) 12/21/2015   CRP elevated 07/20/2015   Encounter for long-term (current) use of high-risk medication 04/20/2015   Mild obesity 04/20/2015   Rheumatoid arthritis of multiple sites with negative rheumatoid factor (HCC) 04/20/2015   Macular degeneration,  dry 02/12/2014   GERD (gastroesophageal reflux disease) 11/27/2013    PCP: [name] Perfect, PA-C  REFERRING PROVIDER: Orvin Daring, NP  REFERRING DIAG: I89.0  THERAPY DIAG:  Lymphedema, not elsewhere classified  Rationale for Evaluation and Treatment: Rehabilitation  ONSET DATE: Lipedema onset at puberty (+ family hx on both sides)      Lymphedema onset pre menopause ~ 10 yrs ago (2015)  SUBJECTIVE:                                                                                                                                                                                           SUBJECTIVE STATEMENT:  [name] presents to OT for treatment of BLE lipo-lymphedema. Pt reports pain, described as heaviness, earlier this week. Pt was last seen on 01/07/24. She reports she continues to follow along with vascular specialist.  Pt states she has not yet contacted [employer] rep about Flexitouch, but is interested in exploring the advanced device option after the first of the year.   PERTINENT HISTORY:  RA onset 2009 (+ family hx), Lipedema (reported), Obesity - induced obesity (BMI 37.9 today), varicose veins  PAIN:  Are you having pain? Yes: NPRS scale: 6/10 Pain location: knees distally, bilateral ankles Pain description: heaviness, fullness, fatigued Aggravating factors: extended standing and sitting-gravity dependent positioning Relieving factors: elevation, pumps (Biotab)  PRECAUTIONS: Other: LYMPHEDEMA PRECAUTIONS  WEIGHT BEARING RESTRICTIONS: No   FALLS:  Has patient fallen in last 6 months? No  LIVING ENVIRONMENT: Lives with: lives with their spouse Lives in: House/apartment Stairs: Yes; External: 4 steps; can reach both Has following equipment at home: None  OCCUPATION: Dental hygienist-full time  [place]URE: [place]  HAND DOMINANCE: right   PRIOR LEVEL OF FUNCTION: Independent  PATIENT GOALS:  1 Learn how to minimize swelling 2. Work on weight loss 3.  Reduce limb volume 4. Reduce discomfort 5. Keep swelling from getting worse  OBJECTIVE: Note: Objective measures were completed at Evaluation unless otherwise noted.  COGNITION:  Overall cognitive status: Within functional limits for tasks assessed   LYMPHEDEMA OBSERVATIONS / OTHER ASSESSMENTS:   POSTURE: WNL  LE ROM: WNL  BLE COMPARATIVE LIMB VOLUMETRICS  10/23/23 INITIAL  LANDMARK RIGHT  (dominant)  R LEG (A-D) 4045.1 ml  R THIGH (E-G) 7657.4 ml  R FULL LIMB (A-G) 11702.5 ml  Limb Volume differential (LVD)  %  Volume change since initial %  Volume change overall V  (Blank rows = not tested)  LANDMARK LEFT   L LEG (A-D) 4133.6 ml  L THIGH (E-G) 7492.7 ml  L FULL LIMB (A-G) 11676.2 ml  Limb Volume differential (LVD)  LEG Limb Volume Differential (LVD) measures 3.4%, L>R. THIGH LVD measures 2.2 %, R>L, and LVD for full limb measures 0.2%, R>L.   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   Skin  Description Hyper-Keratosis Peau d' Orange Shiny Tight Fibrotic/ Indurated Fatty Doughy Spongy/ boggy       x x x x   Skin dry Flaky WNL Macerated     x    Color Redness Varicosities Blanching Hemosiderin Stain Mottled        x   Odor Malodorous Yeast Fungal infection  WNL      x   Temperature Warm Cool wnl    x     Pitting Edema   1+ 2+ 3+ 4+ Non-pitting         x   Girth Symmetrical Asymmetrical                   Distribution   x  Lower quadrants bilaterally: toes to groin, abdomen, hips, and  buttocks    Stemmer Sign Positive Negative    x   Lymphorrhea History Of:  Present Absent     x    Wounds History Of Present Absent Venous Arterial Pressure Sheer     x        Signs of Infection Redness Warmth Erythema Acute Swelling Drainage Borders  Sensation Light Touch Deep pressure Hypersensitivity   In tact Impaired Present In tact  Absent Impaired   x  x  x     Nails WNL   Fungus nail dystrophy   x     Hair Growth Symmetrical Asymmetrical   x    Skin Creases Base of toes  Ankles   Base of Fingers knees       Abdominal pannus Thigh Lobules  Face/neck    x    x      LYMPHEDEMA LIFE IMPACT SCALE (LLIS):52.94% (The extent to which LE-related problems impacted your life last week)   PATIENT EDUCATION:  Continued Pt/ CG edu for lymphedema self care home program throughout session. Topics include outcome of comparative limb volumetrics- starting limb volume differentials (LVDs), technology and gradient techniques used for short stretch, multilayer compression wrapping, simple self-MLD, therapeutic lymphatic pumping exercises, skin/nail care, LE precautions, compression garment recommendations and specifications, wear and care schedule and compression garment donning / doffing w assistive devices. Discussed progress towards all OT goals since commencing CDT. Discussed detrimental impact of obesity on lower and upper extremity lymphedema over time. Reviewed OT goals for lymphedema care with Pt and discussed progress to date.  All questions answered to the Pt's satisfaction. Good return. Person educated: Patient  Education method: Explanation, Demonstration, and Handouts Education comprehension: verbalized understanding, returned demonstration, verbal cues required, and needs further education   LYMPHEDEMA SELF-CARE HOME PROGRAM: BLE lymphatic pumping there ex- 1 set of 10 each element, in order. Hold 5. 2 x daily 2. Daily, short stretch, thigh length, multilayer compression bandages during Intensive Phase CDT 3. Custom-made gradient compression garments and HOS devices are medically necessary because they are uniquely sized and shaped to fit the exact dimensions of the affected extremities, and to provide appropriate medical grade, graduated compression essential for optimally managing chronic, progressive lymphedema.  Multiple custom compression garments are needed to ensure proper hygiene to limit infection risk. Custom compression garments should be replaced q 3-6 months When worn consistently for optimal lipo-lymphedema self-management over time. HOS devices, medically necessary to limit fibrosis buildup in tissue, should be replaced q 2 years and PRN when worn out.   During self-Management Phase appropriate thigh high compression stockings paired with compression biker shorts (medical grade TBD) 3. Daily skin care with low ph lotion matching skin ph 4. Daily simple self MLD     ASSESSMENT:   CLINICAL IMPRESSION: Continued MLD today to  LLE/LLQ in supine utilizing functional inguinal watershed. Pt able to correctly perform short neck sequence and diaphragmatic breathing at correct times during sequences with 1 verbal cue only. Continued teaching Pt lymphatic structure and function in relation to MLD. Excellent return. Pt tolerated MLD with no pain. Cont as per POC.  OBJECTIVE IMPAIRMENTS: decreased activity tolerance, decreased knowledge of condition, decreased knowledge of use of DME, decreased ROM, increased edema, obesity, pain, and chronic, progressive BLE BLQ swelling and associate pain.   ACTIVITY LIMITATIONS: impaired functional mobility and ambulation ( sitting, standing, squatting, stairs, and bed mobility). Impaired basic and instrumental ADLs ( extended standing, walking and or sitting to prep food, cook, perform shopping, housekeeping, to drive long distances, to fit shoes and lower body clothing, limits sleep), to perform productive activities ( work duties, caring others); Leg pain and swelling limits leisure pursuits  requiring extended sitting, standing and/ or walking; social participation in the community requiring extended standing, sitting and or walking; psychosocial domain: impacts clothing selection due to trying to disguise  swelling, limits body image limits intimacy w partner, contributes  to depression  PERSONAL FACTORS: Time since onset of injury/illness/exacerbation and 1-3 co morbidities: RA, varicose veins, Lipedema,  are also affecting patient's functional outcome.   REHAB POTENTIAL: Good  EVALUATION COMPLEXITY: Moderate  Goals reviewed with patient? Yes   SHORT TERM GOALS: Target date: 4th OT Rx visit    Pt will demonstrate understanding of lymphedema precautions and prevention strategies with modified independence using a printed reference to identify at least 5 precautions and discussing how s/he may implement them into daily life to reduce risk of progression with modified assistance Baseline: max a Goal status: INITIAL   2. Pt will be able to apply multilayer, knee length, compression wraps using gradient techniques to decrease limb volume, to limit infection risk, and to limit lymphedema progression.   Baseline: Dependent Goal status: INITIAL     LONG TERM GOALS: Target date: 08/05/22 (12 WEEKS)     Given this patient's Intake score of 52.94 % on the Lymphedema Life Impact Scale (LLIS), patient will experience a reduction of at least 10 points in the extent to which LE-related problems impact her daily life to improve functional performance and quality of life (QOL) Baseline:52.94% Goal status: INITIAL   2.  Given this patient's Intake score of TBA/100% on the functional outcomes FOTO tool, patient will experience an increase in function of 5 points to improve basic and instrumental ADLs performance, including lymphedema self-care. (TBA at first OT Rx visit) Baseline: max a Goal status: INITIAL   3.  With modified independence (extra time and assistive devices) Pt will be able to don and doff appropriate compression garments and/or devices to control BLE lymphedema and to limit progression.  Baseline: Dependent Goal status: INITIAL   4. Pt will achieve at least a 10% volume reductions bilaterally below the knees to return limb to more typical size and  shape, to limit infection risk and LE progression, to decrease pain, to improve function, and to improve body image and QOL. Baseline: Dependent Goal status: INITIAL   5. Pt will achieve and sustain at least 85% attendance at OT sessions, and with compliance with all LE self-care home program components throughout CDT, including modified simple self-MLD, daily skin care and inspection, lymphatic pumping the ex and appropriate compression to limit lymphedema progression and to limit further functional decline. Baseline: Dependent Goal status: INITIAL    PLAN:   OT FREQUENCY: 2 x/week   OT DURATION: 18 weeks and PRN   PLANNED INTERVENTIONS:  Complete Decongestive Therapy (CDT) , intensive and self-management phases: elevation, therapeutic exercise, manual lymphatic drainage (MLD), skin care, COMPRESSION BANDAGING AND GARMENTS) Therapeutic activity, Patient/Family education, Self Care, DME instructions  2. Pt should be fit with Custom-made gradient compression garments and HOS devices. These are medically necessary because they are uniquely sized and shaped to fit the exact dimensions of the affected extremities, and to provide appropriate medical grade, graduated compression essential for optimally managing chronic, progressive lymphedema. Multiple custom compression garments are needed to ensure proper hygiene to limit infection risk. Custom compression garments should be replaced q 3-6 months When worn consistently for optimal lipo-lymphedema self-management over time. HOS devices, medically necessary to limit fibrosis buildup in tissue, should be replaced q 2 years and PRN when worn out.  3. Replace basic sequential  vaso-pneumatic pump with advanced Flexitouch Plus sequential pneumatic compression device which includes a truncal component that mobilizes lip-lymphedema of the trunk, abdomen, hips and buttocks following lymphatic anatomy. The  Flexitouch is medically necessary to mobilize  lymphedema above the level of the groin nodes towards abdominal lymph nodes and the thoracic duct to reduce infection risk and decrease volume for optimal LE management over time at home.   PLAN FOR NEXT SESSION:  MLD Compression bandaging (one leg at a time) Pt edu re LE self care    Zebedee Dec, MS, OTR/L, CLT-LANA 02/05/24 4:08 PM

## 2024-02-12 ENCOUNTER — Ambulatory Visit: Admitting: Occupational Therapy

## 2024-02-12 ENCOUNTER — Encounter: Payer: Self-pay | Admitting: Occupational Therapy

## 2024-02-12 DIAGNOSIS — I890 Lymphedema, not elsewhere classified: Secondary | ICD-10-CM | POA: Diagnosis not present

## 2024-02-12 NOTE — Therapy (Signed)
 OUTPATIENT OCCUPATIONAL THERAPY TREATMENT  BILATERAL LOWER EXTREMITY/ BILATERAL LOWER QUADRANT LIPO- LYMPHEDEMA  Patient Name: Victoria Berry MRN: [id number] DOB:[birth year], 53 y.o., female Today's Date: 02/12/2024  END OF SESSION:   OT End of Session - 02/12/24 1607     Visit Number 8    Number of Visits 36    Date for Recertification  05/12/24    OT Start Time 0300    OT Stop Time 0400    OT Time Calculation (min) 60 min    Activity Tolerance Patient tolerated treatment well;No increased pain    Behavior During Therapy WFL for tasks assessed/performed            Past Medical History:  Diagnosis Date   GERD (gastroesophageal reflux disease)    Lipidemia    Obesity    RA (rheumatoid arthritis) (HCC)    Past Surgical History:  Procedure Laterality Date   CESAREAN SECTION  2000   twins   COLONOSCOPY WITH PROPOFOL  N/A 06/24/2015   Procedure: COLONOSCOPY WITH PROPOFOL ;  Surgeon: Donnice Vaughn Manes, MD;  Location: [facility] ENDOSCOPY;  Service: Endoscopy;  Laterality: N/A;   ESOPHAGOGASTRODUODENOSCOPY (EGD) WITH PROPOFOL  N/A 06/24/2015   Procedure: ESOPHAGOGASTRODUODENOSCOPY (EGD) WITH PROPOFOL ;  Surgeon: Donnice Vaughn Manes, MD;  Location: [facility] ENDOSCOPY;  Service: Endoscopy;  Laterality: N/A;   right ureter     TONSILLECTOMY     Patient Active Problem List   Diagnosis Date Noted   Kidney cyst, acquired 03/14/2022   Intermittent palpitations 06/03/2019   Pulsatile tinnitus, right ear 12/25/2017   Anxiety state 01/16/2017   Major depression, melancholic type 01/16/2017   Acquired equinus deformity of both feet 10/25/2016   Chronic pain of right ankle 04/25/2016   Morbid obesity with BMI of 45.0-49.9, adult (HCC) 12/21/2015   CRP elevated 07/20/2015   Encounter for long-term (current) use of high-risk medication 04/20/2015   Mild obesity 04/20/2015   Rheumatoid arthritis of multiple sites with negative rheumatoid factor (HCC) 04/20/2015   Macular degeneration,  dry 02/12/2014   GERD (gastroesophageal reflux disease) 11/27/2013    PCP: [name] Perfect, PA-C  REFERRING PROVIDER: Orvin Daring, NP  REFERRING DIAG: I89.0  THERAPY DIAG:  Lymphedema, not elsewhere classified  Rationale for Evaluation and Treatment: Rehabilitation  ONSET DATE: Lipedema onset at puberty (+ family hx on both sides)      Lymphedema onset pre menopause ~ 10 yrs ago (2015)  SUBJECTIVE:                                                                                                                                                                                           SUBJECTIVE STATEMENT:  Victoria Berry presents to OT for treatment of BLE lipo-lymphedema. Pt reports pain, described as heaviness, earlier this week. Pt was last seen on 01/07/24. She reports she continues to follow along with vascular specialist.  Pt states she has not yet contacted [employer] rep about Flexitouch, but is interested in exploring the advanced device option after the first of the year.   PERTINENT HISTORY:  RA onset 2009 (+ family hx), Lipedema (reported), Obesity - induced obesity (BMI 37.9 today), varicose veins  PAIN:  Are you having pain? Yes: NPRS scale: 6/10 Pain location: knees distally, bilateral ankles Pain description: heaviness, fullness, fatigued Aggravating factors: extended standing and sitting-gravity dependent positioning Relieving factors: elevation, pumps (Biotab)  PRECAUTIONS: Other: LYMPHEDEMA PRECAUTIONS  WEIGHT BEARING RESTRICTIONS: No   FALLS:  Has patient fallen in last 6 months? No  LIVING ENVIRONMENT: Lives with: lives with their spouse Lives in: House/apartment Stairs: Yes; External: 4 steps; can reach both Has following equipment at home: None  OCCUPATION: Dental hygienist-full time  [place]URE: beach  HAND DOMINANCE: right   PRIOR LEVEL OF FUNCTION: Independent  PATIENT GOALS:  1 Learn how to minimize swelling 2. Work on weight loss 3.  Reduce limb volume 4. Reduce discomfort 5. Keep swelling from getting worse  OBJECTIVE: Note: Objective measures were completed at Evaluation unless otherwise noted.  COGNITION:  Overall cognitive status: Within functional limits for tasks assessed   LYMPHEDEMA OBSERVATIONS / OTHER ASSESSMENTS:   POSTURE: WNL  LE ROM: WNL  BLE COMPARATIVE LIMB VOLUMETRICS  10/23/23 INITIAL  LANDMARK RIGHT  (dominant)  R LEG (A-D) 4045.1 ml  R THIGH (E-G) 7657.4 ml  R FULL LIMB (A-G) 11702.5 ml  Limb Volume differential (LVD)  %  Volume change since initial %  Volume change overall V  (Blank rows = not tested)  LANDMARK LEFT   L LEG (A-D) 4133.6 ml  L THIGH (E-G) 7492.7 ml  L FULL LIMB (A-G) 11676.2 ml  Limb Volume differential (LVD)  LEG Limb Volume Differential (LVD) measures 3.4%, L>R. THIGH LVD measures 2.2 %, R>L, and LVD for full limb measures 0.2%, R>L.   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   Skin  Description Hyper-Keratosis Peau d' Orange Shiny Tight Fibrotic/ Indurated Fatty Doughy Spongy/ boggy       x x x x   Skin dry Flaky WNL Macerated     x    Color Redness Varicosities Blanching Hemosiderin Stain Mottled        x   Odor Malodorous Yeast Fungal infection  WNL      x   Temperature Warm Cool wnl    x     Pitting Edema   1+ 2+ 3+ 4+ Non-pitting         x   Girth Symmetrical Asymmetrical                   Distribution   x  Lower quadrants bilaterally: toes to groin, abdomen, hips, and  buttocks    Stemmer Sign Positive Negative    x   Lymphorrhea History Of:  Present Absent     x    Wounds History Of Present Absent Venous Arterial Pressure Sheer     x        Signs of Infection Redness Warmth Erythema Acute Swelling Drainage Borders  Sensation Light Touch Deep pressure Hypersensitivity   In tact Impaired Present In tact  Absent Impaired   x  x  x     Nails WNL   Fungus nail dystrophy   x     Hair Growth Symmetrical Asymmetrical   x    Skin Creases Base of toes  Ankles   Base of Fingers knees       Abdominal pannus Thigh Lobules  Face/neck    x    x      LYMPHEDEMA LIFE IMPACT SCALE (LLIS):52.94% (The extent to which LE-related problems impacted your life last week)   PATIENT EDUCATION:  Continued Pt/ CG edu for lymphedema self care home program throughout session. Topics include outcome of comparative limb volumetrics- starting limb volume differentials (LVDs), technology and gradient techniques used for short stretch, multilayer compression wrapping, simple self-MLD, therapeutic lymphatic pumping exercises, skin/nail care, LE precautions, compression garment recommendations and specifications, wear and care schedule and compression garment donning / doffing w assistive devices. Discussed progress towards all OT goals since commencing CDT. Discussed detrimental impact of obesity on lower and upper extremity lymphedema over time. Reviewed OT goals for lymphedema care with Pt and discussed progress to date.  All questions answered to the Pt's satisfaction. Good return. Person educated: Patient  Education method: Explanation, Demonstration, and Handouts Education comprehension: verbalized understanding, returned demonstration, verbal cues required, and needs further education   LYMPHEDEMA SELF-CARE HOME PROGRAM: BLE lymphatic pumping there ex- 1 set of 10 each element, in order. Hold 5. 2 x daily 2. Daily, short stretch, thigh length, multilayer compression bandages during Intensive Phase CDT 3. Custom-made gradient compression garments and HOS devices are medically necessary because they are uniquely sized and shaped to fit the exact dimensions of the affected extremities, and to provide appropriate medical grade, graduated compression essential for optimally managing chronic, progressive lymphedema.  Multiple custom compression garments are needed to ensure proper hygiene to limit infection risk. Custom compression garments should be replaced q 3-6 months When worn consistently for optimal lipo-lymphedema self-management over time. HOS devices, medically necessary to limit fibrosis buildup in tissue, should be replaced q 2 years and PRN when worn out.   During self-Management Phase appropriate thigh high compression stockings paired with compression biker shorts (medical grade TBD) 3. Daily skin care with low ph lotion matching skin ph 4. Daily simple self MLD     ASSESSMENT:   CLINICAL IMPRESSION: Continued MLD today to  LLE/LLQ in supine utilizing functional inguinal watershed. Pt able to correctly perform short neck sequence and diaphragmatic breathing at correct times during sequences with 1 verbal cue only. Continued teaching Pt lymphatic structure and function in relation to MLD. Excellent return. Pt tolerated MLD with no pain. Cont as per POC.  OBJECTIVE IMPAIRMENTS: decreased activity tolerance, decreased knowledge of condition, decreased knowledge of use of DME, decreased ROM, increased edema, obesity, pain, and chronic, progressive BLE BLQ swelling and associate pain.   ACTIVITY LIMITATIONS: impaired functional mobility and ambulation ( sitting, standing, squatting, stairs, and bed mobility). Impaired basic and instrumental ADLs ( extended standing, walking and or sitting to prep food, cook, perform shopping, housekeeping, to drive long distances, to fit shoes and lower body clothing, limits sleep), to perform productive activities ( work duties, caring others); Leg pain and swelling limits leisure pursuits  requiring extended sitting, standing and/ or walking; social participation in the community requiring extended standing, sitting and or walking; psychosocial domain: impacts clothing selection due to trying to disguise  swelling, limits body image limits intimacy w partner, contributes  to depression  PERSONAL FACTORS: Time since onset of injury/illness/exacerbation and 1-3 co morbidities: RA, varicose veins, Lipedema,  are also affecting patient's functional outcome.   REHAB POTENTIAL: Good  EVALUATION COMPLEXITY: Moderate  Goals reviewed with patient? Yes   SHORT TERM GOALS: Target date: 4th OT Rx visit    Pt will demonstrate understanding of lymphedema precautions and prevention strategies with modified independence using a printed reference to identify at least 5 precautions and discussing how s/he may implement them into daily life to reduce risk of progression with modified assistance Baseline: max a Goal status: INITIAL   2. Pt will be able to apply multilayer, knee length, compression wraps using gradient techniques to decrease limb volume, to limit infection risk, and to limit lymphedema progression.   Baseline: Dependent Goal status: INITIAL     LONG TERM GOALS: Target date: 08/05/22 (12 WEEKS)     Given this patient's Intake score of 52.94 % on the Lymphedema Life Impact Scale (LLIS), patient will experience a reduction of at least 10 points in the extent to which LE-related problems impact her daily life to improve functional performance and quality of life (QOL) Baseline:52.94% Goal status: INITIAL   2.  Given this patient's Intake score of TBA/100% on the functional outcomes FOTO tool, patient will experience an increase in function of 5 points to improve basic and instrumental ADLs performance, including lymphedema self-care. (TBA at first OT Rx visit) Baseline: max a Goal status: INITIAL   3.  With modified independence (extra time and assistive devices) Pt will be able to don and doff appropriate compression garments and/or devices to control BLE lymphedema and to limit progression.  Baseline: Dependent Goal status: INITIAL   4. Pt will achieve at least a 10% volume reductions bilaterally below the knees to return limb to more typical size and  shape, to limit infection risk and LE progression, to decrease pain, to improve function, and to improve body image and QOL. Baseline: Dependent Goal status: INITIAL   5. Pt will achieve and sustain at least 85% attendance at OT sessions, and with compliance with all LE self-care home program components throughout CDT, including modified simple self-MLD, daily skin care and inspection, lymphatic pumping the ex and appropriate compression to limit lymphedema progression and to limit further functional decline. Baseline: Dependent Goal status: INITIAL    PLAN:   OT FREQUENCY: 2 x/week   OT DURATION: 18 weeks and PRN   PLANNED INTERVENTIONS:  Complete Decongestive Therapy (CDT) , intensive and self-management phases: elevation, therapeutic exercise, manual lymphatic drainage (MLD), skin care, COMPRESSION BANDAGING AND GARMENTS) Therapeutic activity, Patient/Family education, Self Care, DME instructions  2. Pt should be fit with Custom-made gradient compression garments and HOS devices. These are medically necessary because they are uniquely sized and shaped to fit the exact dimensions of the affected extremities, and to provide appropriate medical grade, graduated compression essential for optimally managing chronic, progressive lymphedema. Multiple custom compression garments are needed to ensure proper hygiene to limit infection risk. Custom compression garments should be replaced q 3-6 months When worn consistently for optimal lipo-lymphedema self-management over time. HOS devices, medically necessary to limit fibrosis buildup in tissue, should be replaced q 2 years and PRN when worn out.  3. Replace basic sequential  vaso-pneumatic pump with advanced Flexitouch Plus sequential pneumatic compression device which includes a truncal component that mobilizes lip-lymphedema of the trunk, abdomen, hips and buttocks following lymphatic anatomy. The  Flexitouch is medically necessary to mobilize  lymphedema above the level of the groin nodes towards abdominal lymph nodes and the thoracic duct to reduce infection risk and decrease volume for optimal LE management over time at home.   PLAN FOR NEXT SESSION:  MLD Pt edu re LE self care    Zebedee Dec, MS, OTR/L, CLT-LANA 02/12/24 4:09 PM

## 2024-02-24 ENCOUNTER — Ambulatory Visit: Admitting: Occupational Therapy

## 2024-02-26 ENCOUNTER — Encounter: Payer: Self-pay | Admitting: Occupational Therapy

## 2024-02-26 ENCOUNTER — Ambulatory Visit: Attending: Nurse Practitioner | Admitting: Occupational Therapy

## 2024-02-26 DIAGNOSIS — I890 Lymphedema, not elsewhere classified: Secondary | ICD-10-CM | POA: Diagnosis present

## 2024-02-26 NOTE — Therapy (Signed)
 OUTPATIENT OCCUPATIONAL THERAPY TREATMENT  BILATERAL LOWER EXTREMITY/ BILATERAL LOWER QUADRANT LIPO- LYMPHEDEMA  Patient Name: Victoria Berry MRN: [id number] DOB:1971-01-09, 53 y.o., female Today's Date: 02/26/2024  END OF SESSION:   OT End of Session - 02/26/24 1518     Visit Number 9    Number of Visits 36    Date for Recertification  05/12/24    OT Start Time 0200    OT Stop Time 0315    OT Time Calculation (min) 75 min    Activity Tolerance Patient tolerated treatment well;No increased pain    Behavior During Therapy WFL for tasks assessed/performed            Past Medical History:  Diagnosis Date   GERD (gastroesophageal reflux disease)    Lipidemia    Obesity    RA (rheumatoid arthritis) (HCC)    Past Surgical History:  Procedure Laterality Date   CESAREAN SECTION  2000   twins   COLONOSCOPY WITH PROPOFOL  N/A 06/24/2015   Procedure: COLONOSCOPY WITH PROPOFOL ;  Surgeon: Donnice Vaughn Manes, MD;  Location: [facility] ENDOSCOPY;  Service: Endoscopy;  Laterality: N/A;   ESOPHAGOGASTRODUODENOSCOPY (EGD) WITH PROPOFOL  N/A 06/24/2015   Procedure: ESOPHAGOGASTRODUODENOSCOPY (EGD) WITH PROPOFOL ;  Surgeon: Donnice Vaughn Manes, MD;  Location: [facility] ENDOSCOPY;  Service: Endoscopy;  Laterality: N/A;   right ureter     TONSILLECTOMY     Patient Active Problem List   Diagnosis Date Noted   Kidney cyst, acquired 03/14/2022   Intermittent palpitations 06/03/2019   Pulsatile tinnitus, right ear 12/25/2017   Anxiety state 01/16/2017   Major depression, melancholic type 01/16/2017   Acquired equinus deformity of both feet 10/25/2016   Chronic pain of right ankle 04/25/2016   Morbid obesity with BMI of 45.0-49.9, adult (HCC) 12/21/2015   CRP elevated 07/20/2015   Encounter for long-term (current) use of high-risk medication 04/20/2015   Mild obesity 04/20/2015   Rheumatoid arthritis of multiple sites with negative rheumatoid factor (HCC) 04/20/2015   Macular degeneration,  dry 02/12/2014   GERD (gastroesophageal reflux disease) 11/27/2013    PCP: [name] Perfect, PA-C  REFERRING PROVIDER: Orvin Daring, NP  REFERRING DIAG: I89.0  THERAPY DIAG:  Lymphedema, not elsewhere classified  Rationale for Evaluation and Treatment: Rehabilitation  ONSET DATE: Lipedema onset at puberty (+ family hx on both sides)      Lymphedema onset pre menopause ~ 10 yrs ago (2015)  SUBJECTIVE:                                                                                                                                                                                           SUBJECTIVE STATEMENT:  Victoria Berry presents to OT for treatment of BLE lipo-lymphedema. Pt reports pain, described as heaviness, earlier this week. Pt She reports she continues to follow along with vascular specialist.  Pt's goal for this session is to get garment measurements completed before her insurance benefits end for this year. I just need compression stockings that don't hurt, or cut me at the ankles, and don't fall down.  PERTINENT HISTORY:  RA onset 2009 (+ family hx), Lipedema (reported), Obesity - induced obesity (BMI 37.9 today), varicose veins  PAIN:  Are you having pain? Yes: NPRS scale: 6/10 Pain location: knees distally, bilateral ankles Pain description: heaviness, fullness, fatigued Aggravating factors: extended standing and sitting-gravity dependent positioning Relieving factors: elevation, pumps (Biotab)  PRECAUTIONS: Other: LYMPHEDEMA PRECAUTIONS  WEIGHT BEARING RESTRICTIONS: No   FALLS:  Has patient fallen in last 6 months? No  LIVING ENVIRONMENT: Lives with: lives with their spouse Lives in: House/apartment Stairs: Yes; External: 4 steps; can reach both Has following equipment at home: None  OCCUPATION: Dental hygienist-full time  [place]  HAND DOMINANCE: right   PRIOR LEVEL OF FUNCTION: Independent  PATIENT GOALS:  1 Learn how to minimize  swelling 2. Work on weight loss 3. Reduce limb volume 4. Reduce discomfort 5. Keep swelling from getting worse  OBJECTIVE: Note: Objective measures were completed at Evaluation unless otherwise noted.  COGNITION:  Overall cognitive status: Within functional limits for tasks assessed   LYMPHEDEMA OBSERVATIONS / OTHER ASSESSMENTS:   POSTURE: WNL  LE ROM: WNL  BLE COMPARATIVE LIMB VOLUMETRICS  10/23/23 INITIAL  LANDMARK RIGHT  (dominant)  R LEG (A-D) 4045.1 ml  R THIGH (E-G) 7657.4 ml  R FULL LIMB (A-G) 11702.5 ml  Limb Volume differential (LVD)  %  Volume change since initial %  Volume change overall V  (Blank rows = not tested)  LANDMARK LEFT   L LEG (A-D) 4133.6 ml  L THIGH (E-G) 7492.7 ml  L FULL LIMB (A-G) 11676.2 ml  Limb Volume differential (LVD)  LEG Limb Volume Differential (LVD) measures 3.4%, L>R. THIGH LVD measures 2.2 %, R>L, and LVD for full limb measures 0.2%, R>L.   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   Skin  Description Hyper-Keratosis Peau d' Orange Shiny Tight Fibrotic/ Indurated Fatty Doughy Spongy/ boggy       x x x x   Skin dry Flaky WNL Macerated     x    Color Redness Varicosities Blanching Hemosiderin Stain Mottled        x   Odor Malodorous Yeast Fungal infection  WNL      x   Temperature Warm Cool wnl    x     Pitting Edema   1+ 2+ 3+ 4+ Non-pitting         x   Girth Symmetrical Asymmetrical                   Distribution   x  Lower quadrants bilaterally: toes to groin, abdomen, hips, and  buttocks    Stemmer Sign Positive Negative    x   Lymphorrhea History Of:  Present Absent     x    Wounds History Of Present Absent Venous Arterial Pressure Sheer     x        Signs of Infection Redness Warmth Erythema Acute Swelling Drainage Borders  Sensation Light Touch Deep pressure Hypersensitivity    In tact Impaired Present In tact Absent Impaired   x  x  x     Nails WNL   Fungus nail dystrophy   x     Hair Growth Symmetrical Asymmetrical   x    Skin Creases Base of toes  Ankles   Base of Fingers knees       Abdominal pannus Thigh Lobules  Face/neck    x    x      LYMPHEDEMA LIFE IMPACT SCALE (LLIS):52.94% (The extent to which LE-related problems impacted your life last week)   PATIENT EDUCATION:  Continued Pt/ CG edu for lymphedema self care home program throughout session. Topics include outcome of comparative limb volumetrics- starting limb volume differentials (LVDs), technology and gradient techniques used for short stretch, multilayer compression wrapping, simple self-MLD, therapeutic lymphatic pumping exercises, skin/nail care, LE precautions, compression garment recommendations and specifications, wear and care schedule and compression garment donning / doffing w assistive devices. Discussed progress towards all OT goals since commencing CDT. Discussed detrimental impact of obesity on lower and upper extremity lymphedema over time. Reviewed OT goals for lymphedema care with Pt and discussed progress to date.  All questions answered to the Pt's satisfaction. Good return. Person educated: Patient  Education method: Explanation, Demonstration, and Handouts Education comprehension: verbalized understanding, returned demonstration, verbal cues required, and needs further education   LYMPHEDEMA SELF-CARE HOME PROGRAM: BLE lymphatic pumping there ex- 1 set of 10 each element, in order. Hold 5. 2 x daily 2. Daily, short stretch, thigh length, multilayer compression bandages during Intensive Phase CDT-Deferred 3. During self-Management Phase appropriate thigh high compression stockings paired with compression biker shorts (medical grade TBD) 3. Daily skin care with low ph lotion matching skin ph 4. Daily simple self MLD     ASSESSMENT:   CLINICAL IMPRESSION: Provided Pt  education throughout session on differences between circular knit and flat knit, off the shelf and custom compression garments, importance of transitions at skin folds instead of bunching at the ankles. Discussed importance of correct fabric selection to accommodate and support lipo-lymphedema. Discussed measuring and fitting processes and role of DME provider. Pt asked good questions and demonstrated understanding of important issues to make informed choices about garment options matching her condition and lifestyle. After skilled education Pt and therapist completed appropriate specifications for medically necessary compression garments for optimal control of  chronic, progressive , Lipo-lymphedema. Custom-made gradient compression garments and HOS devices are medically necessary because they are uniquely sized and shaped to fit the exact dimensions of the affected extremities, and to provide appropriate medical grade, graduated compression essential for optimally managing chronic, progressive lymphedema. Multiple custom compression garments are needed to ensure proper hygiene to limit infection risk. Custom compression garments should be replaced q 3-6 months When worn consistently for optimal lipo-lymphedema self-management over time. HOS devices, medically necessary to limit fibrosis buildup in tissue, should be replaced q 2 years and PRN when worn out.   Pt should be fit with:  QUANTITY 2 for each LEG: BLE, custom , Jobst, Elvarex Classic, flat knit ccl 2 ( 23-32 mmHg) knee length compression stockings with T heel. 2.5 cm wide silicone top band, tricot pocket at instep,and oblique top edge and open toe. QUANTITY 1 each leg: custom, knee length, ccl 2, Jobst, RELAX, convoluted hours of sleep device to limit re accumulation of stagnant fluid in limbs during rest , or HOS, to limit fibrosis formation, and to facilitate  improved lymphatic and venous functional rest.    Pt donned off the shelf compression  pantyhose after session. These are too long for her petite stature and do not control lipo-lymphedema. Cont as per POC.  OBJECTIVE IMPAIRMENTS: decreased activity tolerance, decreased knowledge of condition, decreased knowledge of use of DME, decreased ROM, increased edema, obesity, pain, and chronic, progressive BLE BLQ swelling and associate pain.   ACTIVITY LIMITATIONS: impaired functional mobility and ambulation ( sitting, standing, squatting, stairs, and bed mobility). Impaired basic and instrumental ADLs ( extended standing, walking and or sitting to prep food, cook, perform shopping, housekeeping, to drive long distances, to fit shoes and lower body clothing, limits sleep), to perform productive activities ( work duties, caring others); Leg pain and swelling limits leisure pursuits  requiring extended sitting, standing and/ or walking; social participation in the community requiring extended standing, sitting and or walking; psychosocial domain: impacts clothing selection due to trying to disguise swelling, limits body image limits intimacy w partner, contributes to depression  PERSONAL FACTORS: Time since onset of injury/illness/exacerbation and 1-3 co morbidities: RA, varicose veins, Lipedema,  are also affecting patient's functional outcome.   REHAB POTENTIAL: Good  EVALUATION COMPLEXITY: Moderate  Goals reviewed with patient? Yes   SHORT TERM GOALS: Target date: 4th OT Rx visit    Pt will demonstrate understanding of lymphedema precautions and prevention strategies with modified independence using a printed reference to identify at least 5 precautions and discussing how s/he may implement them into daily life to reduce risk of progression with modified assistance Baseline: max a Goal status: INITIAL   2. Pt will be able to apply multilayer, knee length, compression wraps using gradient techniques to decrease limb volume, to limit infection risk, and to limit lymphedema progression.    Baseline: Dependent Goal status: INITIAL     LONG TERM GOALS: Target date: 08/05/22 (12 WEEKS)     Given this patient's Intake score of 52.94 % on the Lymphedema Life Impact Scale (LLIS), patient will experience a reduction of at least 10 points in the extent to which LE-related problems impact her daily life to improve functional performance and quality of life (QOL) Baseline:52.94% Goal status: INITIAL   2.  Given this patient's Intake score of TBA/100% on the functional outcomes FOTO tool, patient will experience an increase in function of 5 points to improve basic and instrumental ADLs performance, including lymphedema self-care. (TBA at first OT Rx visit) Baseline: max a Goal status: INITIAL   3.  With modified independence (extra time and assistive devices) Pt will be able to don and doff appropriate compression garments and/or devices to control BLE lymphedema and to limit progression.  Baseline: Dependent Goal status: INITIAL   4. Pt will achieve at least a 10% volume reductions bilaterally below the knees to return limb to more typical size and shape, to limit infection risk and LE progression, to decrease pain, to improve function, and to improve body image and QOL. Baseline: Dependent Goal status: INITIAL   5. Pt will achieve and sustain at least 85% attendance at OT sessions, and with compliance with all LE self-care home program components throughout CDT, including modified simple self-MLD, daily skin care and inspection, lymphatic pumping the ex and appropriate compression to limit lymphedema progression and to limit further functional decline. Baseline: Dependent Goal status: INITIAL    PLAN:   OT FREQUENCY: 2 x/week   OT DURATION: 18 weeks and PRN   PLANNED INTERVENTIONS:  Complete Decongestive Therapy (CDT) , intensive  and self-management phases: elevation, therapeutic exercise, manual lymphatic drainage (MLD), skin care, COMPRESSION BANDAGING AND GARMENTS)  Therapeutic activity, Patient/Family education, Self Care, DME instructions  2. Pt should be fit with Custom-made gradient compression garments and HOS devices. These are medically necessary because they are uniquely sized and shaped to fit the exact dimensions of the affected extremities, and to provide appropriate medical grade, graduated compression essential for optimally managing chronic, progressive lymphedema. Multiple custom compression garments are needed to ensure proper hygiene to limit infection risk. Custom compression garments should be replaced q 3-6 months When worn consistently for optimal lipo-lymphedema self-management over time. HOS devices, medically necessary to limit fibrosis buildup in tissue, should be replaced q 2 years and PRN when worn out.  3. Replace basic sequential  vaso-pneumatic pump with advanced Flexitouch Plus sequential pneumatic compression device which includes a truncal component that mobilizes lip-lymphedema of the trunk, abdomen, hips and buttocks following lymphatic anatomy. The Flexitouch is medically necessary to mobilize lymphedema above the level of the groin nodes towards abdominal lymph nodes and the thoracic duct to reduce infection risk and decrease volume for optimal LE management over time at home.   PLAN FOR NEXT SESSION:  MLD Pt edu re LE self care    Zebedee Dec, MS, OTR/L, CLT-LANA 02/26/24 3:21 PM

## 2024-03-11 ENCOUNTER — Ambulatory Visit: Admitting: Occupational Therapy

## 2024-03-11 DIAGNOSIS — I890 Lymphedema, not elsewhere classified: Secondary | ICD-10-CM | POA: Diagnosis not present

## 2024-03-11 NOTE — Therapy (Deleted)
 OUTPATIENT OCCUPATIONAL THERAPY TREATMENT NOTE AND PROGRESS REPORT  BILATERAL LOWER EXTREMITY/ BILATERAL LOWER QUADRANT LIPO- LYMPHEDEMA  Patient Name: Victoria Berry MRN: [id number] DOB:Sep 16, 1970, 53 y.o., female Today's Date: 03/13/2024  REPORTING PERIOD:  10/09/23 - 03/11/24  END OF SESSION:       Past Medical History:  Diagnosis Date   GERD (gastroesophageal reflux disease)    Lipidemia    Obesity    RA (rheumatoid arthritis) (HCC)    Past Surgical History:  Procedure Laterality Date   CESAREAN SECTION  2000   twins   COLONOSCOPY WITH PROPOFOL  N/A 06/24/2015   Procedure: COLONOSCOPY WITH PROPOFOL ;  Surgeon: Victoria Vaughn Manes, MD;  Location: [facility] ENDOSCOPY;  Service: Endoscopy;  Laterality: N/A;   ESOPHAGOGASTRODUODENOSCOPY (EGD) WITH PROPOFOL  N/A 06/24/2015   Procedure: ESOPHAGOGASTRODUODENOSCOPY (EGD) WITH PROPOFOL ;  Surgeon: Victoria Vaughn Manes, MD;  Location: [facility] ENDOSCOPY;  Service: Endoscopy;  Laterality: N/A;   right ureter     TONSILLECTOMY     Patient Active Problem List   Diagnosis Date Noted   Kidney cyst, acquired 03/14/2022   Intermittent palpitations 06/03/2019   Pulsatile tinnitus, right ear 12/25/2017   Anxiety state 01/16/2017   Major depression, melancholic type 01/16/2017   Acquired equinus deformity of both feet 10/25/2016   Chronic pain of right ankle 04/25/2016   Morbid obesity with BMI of 45.0-49.9, adult (HCC) 12/21/2015   CRP elevated 07/20/2015   Encounter for long-term (current) use of high-risk medication 04/20/2015   Mild obesity 04/20/2015   Rheumatoid arthritis of multiple sites with negative rheumatoid factor (HCC) 04/20/2015   Macular degeneration, dry 02/12/2014   GERD (gastroesophageal reflux disease) 11/27/2013    PCP: Victoria Perfect, PA-C  REFERRING PROVIDER: Orvin Daring, NP  REFERRING DIAG: I89.0  THERAPY DIAG:  Lymphedema, not elsewhere classified  Rationale for Evaluation and Treatment:  Rehabilitation  ONSET DATE: Lipedema onset at puberty (+ family hx on both sides)      Lymphedema onset pre menopause ~ 10 yrs ago (2015)  SUBJECTIVE:                                                                                                                                                                                           SUBJECTIVE STATEMENT: Victoria Berry presents to OT for treatment of BLE lipo-lymphedema. Pt reports pain, described as heaviness, earlier this week. Pt She reports she continues to follow along with vascular specialist.  Manufacturer's reps for [employer] is here today to assist w/ a trial of the advanced [employer] pneumatic compression pump to address BLE secondary lymphedema. Pt has been using a basic Biotab vaso pneumatic compression device  since February 2025. She is unable to tolerate the device because of pain it causes in distal legs and feet  PERTINENT HISTORY:  RA onset 2009 (+ family hx), Lipedema (reported), Obesity - induced obesity (BMI 37.9 today), varicose veins  PAIN:  Are you having pain? Yes: NPRS scale: 6/10 Pain location: knees distally, bilateral ankles Pain description: heaviness, fullness, fatigued Aggravating factors: extended standing and sitting-gravity dependent positioning Relieving factors: elevation, pumps (Biotab)  PRECAUTIONS: Other: LYMPHEDEMA PRECAUTIONS  WEIGHT BEARING RESTRICTIONS: No   FALLS:  Has patient fallen in last 6 months? No  LIVING ENVIRONMENT: Lives with: lives with their spouse Lives in: House/apartment Stairs: Yes; External: 4 steps; can reach both Has following equipment at home: None  OCCUPATION: Dental hygienist-full time  [place]URE: [place]  HAND DOMINANCE: right   PRIOR LEVEL OF FUNCTION: Independent  PATIENT GOALS:  1 Learn how to minimize swelling 2. Work on weight loss 3. Reduce limb volume 4. Reduce discomfort 5. Keep swelling from getting worse  OBJECTIVE: Note:  Objective measures were completed at Evaluation unless otherwise noted.  COGNITION:  Overall cognitive status: Within functional limits for tasks assessed   LYMPHEDEMA OBSERVATIONS / OTHER ASSESSMENTS:   POSTURE: WNL  LE ROM: WNL  BLE COMPARATIVE LIMB VOLUMETRICS  10/23/23 INITIAL  LANDMARK RIGHT  (dominant)  R LEG (A-D) 4045.1 ml  R THIGH (E-G) 7657.4 ml  R FULL LIMB (A-G) 11702.5 ml  Limb Volume differential (LVD)  %  Volume change since initial %  Volume change overall V  (Blank rows = not tested)  LANDMARK LEFT   L LEG (A-D) 4133.6 ml  L THIGH (E-G) 7492.7 ml  L FULL LIMB (A-G) 11676.2 ml  Limb Volume differential (LVD)  LEG Limb Volume Differential (LVD) measures 3.4%, L>R. THIGH LVD measures 2.2 %, R>L, and LVD for full limb measures 0.2%, R>L.   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   Skin  Description Hyper-Keratosis Peau d' Orange Shiny Tight Fibrotic/ Indurated Fatty Doughy Spongy/ boggy       x x x x   Skin dry Flaky WNL Macerated     x    Color Redness Varicosities Blanching Hemosiderin Stain Mottled        x   Odor Malodorous Yeast Fungal infection  WNL      x   Temperature Warm Cool wnl    x     Pitting Edema   1+ 2+ 3+ 4+ Non-pitting         x   Girth Symmetrical Asymmetrical                   Distribution   x  Lower quadrants bilaterally: toes to groin, abdomen, hips, and  buttocks    Stemmer Sign Positive Negative    x   Lymphorrhea History Of:  Present Absent     x    Wounds History Of Present Absent Venous Arterial Pressure Sheer     x        Signs of Infection Redness Warmth Erythema Acute Swelling Drainage Borders                    Sensation Light Touch Deep pressure Hypersensitivity   In tact Impaired Present In tact Absent Impaired   x  x  x     Nails WNL   Fungus nail dystrophy  x     Hair Growth  Symmetrical Asymmetrical   x    Skin Creases Base of toes  Ankles   Base of Fingers knees       Abdominal pannus Thigh Lobules  Face/neck    x    x      LYMPHEDEMA LIFE IMPACT SCALE (LLIS):52.94% (The extent to which LE-related problems impacted your life last week)   PATIENT EDUCATION:  Continued Pt/ CG edu for lymphedema self care home program throughout session. Topics include outcome of comparative limb volumetrics- starting limb volume differentials (LVDs), technology and gradient techniques used for short stretch, multilayer compression wrapping, simple self-MLD, therapeutic lymphatic pumping exercises, skin/nail care, LE precautions, compression garment recommendations and specifications, wear and care schedule and compression garment donning / doffing w assistive devices. Discussed progress towards all OT goals since commencing CDT. Discussed detrimental impact of obesity on lower and upper extremity lymphedema over time. Reviewed OT goals for lymphedema care with Pt and discussed progress to date.  All questions answered to the Pt's satisfaction. Good return. Person educated: Patient  Education method: Explanation, Demonstration, and Handouts Education comprehension: verbalized understanding, returned demonstration, verbal cues required, and needs further education    [facility] PRECAUTIONS: Pt education re precautions related to use of advanced sequential pneumatic compression device. Pt verbalized understanding that she should never use device on 2 arms/legs simultaneously and should not use device 2 x on same day to avoid overloading her heart with fluid volume return both at present, and in years to come should her medical condition change. Pt instructed to remove device immediately should she experience atypical SOP, light headedness, or acute pain. Pt instructed to discontinue pump if she suspects, or has any infection, blood clot, cellulitis, the flu, corona virus,  etc.  LYMPHEDEMA SELF-CARE HOME PROGRAM: BLE lymphatic pumping there ex- 1 set of 10 each element, in order. Hold 5. 2 x daily 2. Daily, short stretch, thigh length, multilayer compression bandages during Intensive Phase CDT-Deferred 3. During self-Management Phase appropriate thigh high compression stockings paired with compression biker shorts (medical grade TBD) 3. Daily skin care with low ph lotion matching skin ph 4. Daily simple self MLD     ASSESSMENT:   CLINICAL IMPRESSION: Provided Pt education throughout session on differences between circular knit and flat knit, off the shelf and custom compression garments, importance of transitions at skin folds instead of bunching at the ankles. Discussed importance of correct fabric selection to accommodate and support lipo-lymphedema. Discussed measuring and fitting processes and role of DME provider. Pt asked good questions and demonstrated understanding of important issues to make informed choices about garment options matching her condition and lifestyle. After skilled education Pt and therapist completed appropriate specifications for medically necessary compression garments for optimal control of  chronic, progressive , Lipo-lymphedema. Custom-made gradient compression garments and HOS devices are medically necessary because they are uniquely sized and shaped to fit the exact dimensions of the affected extremities, and to provide appropriate medical grade, graduated compression essential for optimally managing chronic, progressive lymphedema. Multiple custom compression garments are needed to ensure proper hygiene to limit infection risk. Custom compression garments should be replaced q 3-6 months When worn consistently for optimal lipo-lymphedema self-management over time. HOS devices, medically necessary to limit fibrosis buildup in tissue, should be replaced q 2 years and PRN when worn out.   Pt should be fit with:  QUANTITY 2 for each LEG: BLE,  custom , Jobst, Elvarex Classic, flat knit ccl 2 (  23-32 mmHg) knee length compression stockings with T heel. 2.5 cm wide silicone top band, tricot pocket at instep,and oblique top edge and open toe. QUANTITY 1 each leg: custom, knee length, ccl 2, Jobst, RELAX, convoluted hours of sleep device to limit re accumulation of stagnant fluid in limbs during rest , or HOS, to limit fibrosis formation, and to facilitate improved lymphatic and venous functional rest.    Pt donned off the shelf compression pantyhose after session. These are too long for her petite stature and do not control lipo-lymphedema. Cont as per POC.  OBJECTIVE IMPAIRMENTS: decreased activity tolerance, decreased knowledge of condition, decreased knowledge of use of DME, decreased ROM, increased edema, obesity, pain, and chronic, progressive BLE BLQ swelling and associate pain.   ACTIVITY LIMITATIONS: impaired functional mobility and ambulation ( sitting, standing, squatting, stairs, and bed mobility). Impaired basic and instrumental ADLs ( extended standing, walking and or sitting to prep food, cook, perform shopping, housekeeping, to drive long distances, to fit shoes and lower body clothing, limits sleep), to perform productive activities ( work duties, caring others); Leg pain and swelling limits leisure pursuits  requiring extended sitting, standing and/ or walking; social participation in the community requiring extended standing, sitting and or walking; psychosocial domain: impacts clothing selection due to trying to disguise swelling, limits body image limits intimacy w partner, contributes to depression  PERSONAL FACTORS: Time since onset of injury/illness/exacerbation and 1-3 co morbidities: RA, varicose veins, Lipedema,  are also affecting patient's functional outcome.   REHAB POTENTIAL: Good  EVALUATION COMPLEXITY: Moderate  Goals reviewed with patient? Yes   SHORT TERM GOALS: Target date: 4th OT Rx visit    Pt will  demonstrate understanding of lymphedema precautions and prevention strategies with modified independence using a printed reference to identify at least 5 precautions and discussing how s/he may implement them into daily life to reduce risk of progression with modified assistance Baseline: max a Goal status: INITIAL   2. Pt will be able to apply multilayer, knee length, compression wraps using gradient techniques to decrease limb volume, to limit infection risk, and to limit lymphedema progression.   Baseline: Dependent Goal status: INITIAL     LONG TERM GOALS: Target date: 08/05/22 (12 WEEKS)     Given this patient's Intake score of 52.94 % on the Lymphedema Life Impact Scale (LLIS), patient will experience a reduction of at least 10 points in the extent to which LE-related problems impact her daily life to improve functional performance and quality of life (QOL) Baseline:52.94% Goal status: INITIAL   2.  Given this patient's Intake score of TBA/100% on the functional outcomes FOTO tool, patient will experience an increase in function of 5 points to improve basic and instrumental ADLs performance, including lymphedema self-care. (TBA at first OT Rx visit) Baseline: max a Goal status: INITIAL   3.  With modified independence (extra time and assistive devices) Pt will be able to don and doff appropriate compression garments and/or devices to control BLE lymphedema and to limit progression.  Baseline: Dependent Goal status: INITIAL   4. Pt will achieve at least a 10% volume reductions bilaterally below the knees to return limb to more typical size and shape, to limit infection risk and LE progression, to decrease pain, to improve function, and to improve body image and QOL. Baseline: Dependent Goal status: INITIAL   5. Pt will achieve and sustain at least 85% attendance at OT sessions, and with compliance with all LE self-care home program components throughout  CDT, including modified simple  self-MLD, daily skin care and inspection, lymphatic pumping the ex and appropriate compression to limit lymphedema progression and to limit further functional decline. Baseline: Dependent Goal status: INITIAL    PLAN:   OT FREQUENCY: 2 x/week   OT DURATION: 18 weeks and PRN   PLANNED INTERVENTIONS:  Complete Decongestive Therapy (CDT) , intensive and self-management phases: elevation, therapeutic exercise, manual lymphatic drainage (MLD), skin care, COMPRESSION BANDAGING AND GARMENTS) Therapeutic activity, Patient/Family education, Self Care, DME instructions  2. Pt should be fit with Custom-made gradient compression garments and HOS devices. These are medically necessary because they are uniquely sized and shaped to fit the exact dimensions of the affected extremities, and to provide appropriate medical grade, graduated compression essential for optimally managing chronic, progressive lymphedema. Multiple custom compression garments are needed to ensure proper hygiene to limit infection risk. Custom compression garments should be replaced q 3-6 months When worn consistently for optimal lipo-lymphedema self-management over time. HOS devices, medically necessary to limit fibrosis buildup in tissue, should be replaced q 2 years and PRN when worn out.  3. Replace basic sequential  vaso-pneumatic pump with advanced Flexitouch Plus sequential pneumatic compression device which includes a truncal component that mobilizes lip-lymphedema of the trunk, abdomen, hips and buttocks following lymphatic anatomy. The Flexitouch is medically necessary to mobilize lymphedema above the level of the groin nodes towards abdominal lymph nodes and the thoracic duct to reduce infection risk and decrease volume for optimal LE management over time at home.   PLAN FOR NEXT SESSION:  MLD Pt edu re LE self care    Zebedee Dec, MS, OTR/L, CLT-LANA 03/13/24 9:00 AM

## 2024-03-13 NOTE — Therapy (Signed)
 OUTPATIENT OCCUPATIONAL THERAPY TREATMENT NOTE AND PROGRESS REPORT  BILATERAL LOWER EXTREMITY/ BILATERAL LOWER QUADRANT LIPO- LYMPHEDEMA  Patient Name: Victoria Berry MRN: [id number] DOB:May 26, 1970, 53 y.o., female Today's Date: 03/13/2024  REPORTING PERIOD: 10/09/23 - 03/11/24  END OF SESSION:   OT End of Session - 03/13/24 0901     Visit Number 10    Number of Visits 36    Date for Recertification  05/12/24    OT Start Time 0315    OT Stop Time 0415    OT Time Calculation (min) 60 min    Activity Tolerance Patient tolerated treatment well;No increased pain    Behavior During Therapy WFL for tasks assessed/performed            Past Medical History:  Diagnosis Date   GERD (gastroesophageal reflux disease)    Lipidemia    Obesity    RA (rheumatoid arthritis) (HCC)    Past Surgical History:  Procedure Laterality Date   CESAREAN SECTION  2000   twins   COLONOSCOPY WITH PROPOFOL  N/A 06/24/2015   Procedure: COLONOSCOPY WITH PROPOFOL ;  Surgeon: Donnice Vaughn Manes, MD;  Location: [facility] ENDOSCOPY;  Service: Endoscopy;  Laterality: N/A;   ESOPHAGOGASTRODUODENOSCOPY (EGD) WITH PROPOFOL  N/A 06/24/2015   Procedure: ESOPHAGOGASTRODUODENOSCOPY (EGD) WITH PROPOFOL ;  Surgeon: Donnice Vaughn Manes, MD;  Location: [facility] ENDOSCOPY;  Service: Endoscopy;  Laterality: N/A;   right ureter     TONSILLECTOMY     Patient Active Problem List   Diagnosis Date Noted   Kidney cyst, acquired 03/14/2022   Intermittent palpitations 06/03/2019   Pulsatile tinnitus, right ear 12/25/2017   Anxiety state 01/16/2017   Major depression, melancholic type 01/16/2017   Acquired equinus deformity of both feet 10/25/2016   Chronic pain of right ankle 04/25/2016   Morbid obesity with BMI of 45.0-49.9, adult (HCC) 12/21/2015   CRP elevated 07/20/2015   Encounter for long-term (current) use of high-risk medication 04/20/2015   Mild obesity 04/20/2015   Rheumatoid arthritis of multiple sites with  negative rheumatoid factor (HCC) 04/20/2015   Macular degeneration, dry 02/12/2014   GERD (gastroesophageal reflux disease) 11/27/2013    PCP: [name] Perfect, PA-C  REFERRING PROVIDER: Orvin Daring, NP  REFERRING DIAG: I89.0  THERAPY DIAG:  Lymphedema, not elsewhere classified  Rationale for Evaluation and Treatment: Rehabilitation  ONSET DATE: Lipedema onset at puberty (+ family hx on both sides)      Lymphedema onset pre menopause ~ 10 yrs ago (2015)  SUBJECTIVE:  SUBJECTIVE STATEMENT: Victoria Berry presents to OT for treatment of BLE lipo-lymphedema. Pt reports pain, described as heaviness in both lower extremities. Pt She reports she continues to follow along with vascular specialist.  Pt is hopeful that she will be able to obtain an advanced Flexitouch device to assist her with long term lipo-lymphedema self management. She has a Biotab device , but she reports it hurts her feet and legs. [name] from [employer] is here today to assist Pt with a trial of the device.  PERTINENT HISTORY:  RA onset 2009 (+ family hx), Lipedema (reported), Obesity - induced obesity (BMI 37.9 today), varicose veins  PAIN:  Are you having pain? Yes: NPRS scale: 6/10 Pain location: knees distally, bilateral ankles Pain description: heaviness, fullness, fatigued Aggravating factors: extended standing and sitting-gravity dependent positioning Relieving factors: elevation, pumps (Biotab)  PRECAUTIONS: Other: LYMPHEDEMA PRECAUTIONS  WEIGHT BEARING RESTRICTIONS: No   FALLS:  Has patient fallen in last 6 months? No  LIVING ENVIRONMENT: Lives with: lives with their spouse Lives in: House/apartment Stairs: Yes; External: 4 steps; can reach both Has following equipment at home: None  OCCUPATION:  Dental hygienist-full time  [place]  HAND DOMINANCE: right   PRIOR LEVEL OF FUNCTION: Independent  PATIENT GOALS:  1 Learn how to minimize swelling 2. Work on weight loss 3. Reduce limb volume 4. Reduce discomfort 5. Keep Lymphedema from getting worse  OBJECTIVE: Note: Objective measures were completed at Evaluation unless otherwise noted.  COGNITION:  Overall cognitive status: Within functional limits for tasks assessed   LYMPHEDEMA OBSERVATIONS / OTHER ASSESSMENTS:   POSTURE: WNL  LE ROM: WNL  BLE COMPARATIVE LIMB VOLUMETRICS  10/23/23 INITIAL  LANDMARK RIGHT  (dominant)  R LEG (A-D) 4045.1 ml  R THIGH (E-G) 7657.4 ml  R FULL LIMB (A-G) 11702.5 ml  Limb Volume differential (LVD)  %  Volume change since initial %  Volume change overall V  (Blank rows = not tested)  LANDMARK LEFT   L LEG (A-D) 4133.6 ml  L THIGH (E-G) 7492.7 ml  L FULL LIMB (A-G) 11676.2 ml  Limb Volume differential (LVD)  LEG Limb Volume Differential (LVD) measures 3.4%, L>R. THIGH LVD measures 2.2 %, R>L, and LVD for full limb measures 0.2%, R>L.   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   Skin  Description Hyper-Keratosis Peau d' Orange Shiny Tight Fibrotic/ Indurated Fatty Doughy Spongy/ boggy       x x x x   Skin dry Flaky WNL Macerated     x    Color Redness Varicosities Blanching Hemosiderin Stain Mottled        x   Odor Malodorous Yeast Fungal infection  WNL      x   Temperature Warm Cool wnl    x     Pitting Edema   1+ 2+ 3+ 4+ Non-pitting         x   Girth Symmetrical Asymmetrical                   Distribution   x  Lower quadrants bilaterally: toes to groin, abdomen, hips, and  buttocks    Stemmer Sign Positive Negative    x   Lymphorrhea History Of:  Present Absent     x    Wounds History Of Present Absent Venous Arterial Pressure Sheer     x  Signs of Infection Redness Warmth Erythema Acute Swelling Drainage Borders                    Sensation Light Touch Deep pressure Hypersensitivity   In tact Impaired Present In tact Absent Impaired   x  x  x     Nails WNL   Fungus nail dystrophy   x     Hair Growth Symmetrical Asymmetrical   x    Skin Creases Base of toes  Ankles   Base of Fingers knees       Abdominal pannus Thigh Lobules  Face/neck    x    x      LYMPHEDEMA LIFE IMPACT SCALE (LLIS):52.94% (The extent to which LE-related problems impacted your life last week)   PATIENT EDUCATION:  Continued Pt/ CG edu for lymphedema self care home program throughout session. Topics include outcome of comparative limb volumetrics- starting limb volume differentials (LVDs), technology and gradient techniques used for short stretch, multilayer compression wrapping, simple self-MLD, therapeutic lymphatic pumping exercises, skin/nail care, LE precautions, compression garment recommendations and specifications, wear and care schedule and compression garment donning / doffing w assistive devices. Discussed progress towards all OT goals since commencing CDT. Discussed detrimental impact of obesity on lower and upper extremity lymphedema over time. Reviewed OT goals for lymphedema care with Pt and discussed progress to date.  All questions answered to the Pt's satisfaction. Good return. Person educated: Patient  Education method: Explanation, Demonstration, and Handouts Education comprehension: verbalized understanding, returned demonstration, verbal cues required, and needs further education   Pt/ family education for basic and advanced sequential pneumatic compression devices, or "pump", including clinical indications, how they work, how the basic device differs from the advanced, clinical indications, contraindications and precautions and care and use routines for pneumatic compression devices. Patient/ caregiver's questions were  answered throughout trial and handouts and Internet resources given for reference. FLEXITOUCH PRECAUTIONS: Pt education re precautions related to use of advanced sequential pneumatic compression device. Pt verbalized understanding that she should never use device on 2 arms/legs simultaneously and should not use device 2 x on same day to avoid overloading her heart with fluid volume return both at present, and in years to come should her medical condition change. Pt instructed to remove device     LYMPHEDEMA SELF-CARE HOME PROGRAM: BLE lymphatic pumping there ex- 1 set of 10 each element, in order. Hold 5. 2 x daily 2. Daily, short stretch, thigh length, multilayer compression bandages during Intensive Phase CDT-Deferred 3. During self-Management Phase appropriate thigh high compression stockings paired with compression biker shorts (medical grade TBD) 3. Daily skin care with low ph lotion matching skin ph 4. 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  Pt has undergone skilled Occupational Therapy for conservative Complete Decongestive Therapy (CDT) 2 x monthly for 5 months with chronic swelling and associated pain persisting. Clinical CDT includes compression bandaging and/ or compression garments, therapeutic lymphatic pumping exercise, skin care to limit infection, manual lymphatic drainage (MLD) and elevation. Pt remains 100% compliant with all self-management home program components. Victoria Berry has used a basic [phone]) Biotab sequential compression "pump" intermittently nearly 1 year. She no longer uses the device because it causes pain in her legs and feet. This basic device provides squeeze and release compression   from distal to proximal and does not address progressive lymphedema  in the BLE, abdomen, buttocks and hips,  and associated pain and   tissue fibrosis. Today in clinic Pt completed a trial the advanced ([id number]) Flexitouch sequential pneumatic compression device in the clinic without pain today for a 1-hour using 30-50 mmHg). The Flexitouch is medically necessary to address this patient's chronic, progressive, RLE/RLQ lipo- lymphedema with swelling below the waist and throughout the lower quadrant. The Flexitouch is medically necessary to reduce limb volume and fibrosis, infection risk, and to optimally manage chronic, progressive lymphedema over time at home.     (03/11/24 Custom Compression Garment Recommendations: Provided Pt education throughout session on differences between circular knit and flat knit, off the shelf and custom compression garments, importance of transitions at skin folds instead of bunching at the ankles. Discussed importance of correct fabric selection to accommodate and support lipo-lymphedema. Discussed measuring and fitting processes and role of DME provider. Pt asked good questions and demonstrated understanding of important issues to make informed choices about garment options matching her condition and lifestyle. After skilled education Pt and therapist completed appropriate specifications for medically necessary compression garments for optimal control of  chronic, progressive , Lipo-lymphedema. Custom-made gradient compression garments and HOS devices are medically necessary because they are uniquely sized and shaped to fit the exact dimensions of the affected extremities, and to provide appropriate medical grade, graduated compression essential for optimally managing chronic, progressive lymphedema. Multiple custom compression garments are needed to ensure proper hygiene to limit infection risk. Custom compression garments should be replaced q 3-6 months When worn consistently for optimal lipo-lymphedema self-management over time. HOS devices, medically necessary to limit fibrosis buildup in tissue, should be  replaced q 2 years and PRN when worn out.   Pt should be fit with:  QUANTITY 2 for each LEG: BLE, custom , Jobst, Elvarex Classic, flat knit ccl 2 ( 23-32 mmHg) knee length compression stockings with T heel. 2.5 cm wide silicone top band, tricot pocket at instep,and oblique top edge and open toe. QUANTITY 1 each leg: custom, knee length, ccl 2, Jobst, RELAX, convoluted hours of sleep device to limit re accumulation of stagnant fluid in limbs during rest , or HOS, to limit fibrosis formation, and to facilitate improved lymphatic and venous functional rest.    Pt donned off the shelf compression pantyhose after session. These are too long for her petite stature and do not control lipo-lymphedema. Cont as per POC.)  OBJECTIVE IMPAIRMENTS: decreased activity tolerance, decreased knowledge of condition, decreased knowledge of use of DME, decreased ROM, increased edema, obesity, pain, and chronic, progressive BLE BLQ swelling and associate pain.   ACTIVITY LIMITATIONS: impaired functional mobility and ambulation ( sitting, standing, squatting, stairs, and bed mobility). Impaired basic and instrumental ADLs ( extended standing, walking and or sitting to prep food, cook, perform shopping, housekeeping, to drive long distances, to fit shoes and lower body clothing, limits sleep), to perform productive activities ( work duties, caring others); Leg pain and swelling limits leisure pursuits  requiring extended sitting, standing and/ or walking; social participation in the community requiring extended standing, sitting and or walking; psychosocial domain: impacts clothing selection due to trying to disguise swelling, limits body image limits intimacy w partner, contributes to depression  PERSONAL FACTORS: Time since onset of injury/illness/exacerbation and 1-3 co morbidities: RA, varicose veins, Lipedema,  are also affecting patient's functional outcome.   REHAB POTENTIAL: Good  EVALUATION COMPLEXITY:  Moderate  Goals reviewed with patient? Yes   SHORT TERM GOALS: Target date: 4th OT Rx  visit    Pt will demonstrate understanding of lymphedema precautions and prevention strategies with modified independence using a printed reference to identify at least 5 precautions and discussing how s/he may implement them into daily life to reduce risk of progression with modified assistance Baseline: max a Goal status: GOAL MET   2. Pt will be able to apply multilayer, knee length, compression wraps using gradient techniques to decrease limb volume, to limit infection risk, and to limit lymphedema progression.   Baseline: Dependent Goal status:GOAL NOT MET to date. Compression wraps will not stay in place and fall down to knees.     LONG TERM GOALS: Target date: 06/10/24      Given this patient's Intake score of 52.94 % on the Lymphedema Life Impact Scale (LLIS), patient will experience a reduction of at least 10 points in the extent to which LE-related problems impact her daily life to improve functional performance and quality of life (QOL) Baseline:52.94% Goal status: ONGOING   2.  Given this patient's Intake score of TBA/100% on the functional outcomes FOTO tool, patient will experience an increase in function of 5 points to improve basic and instrumental ADLs performance, including lymphedema self-care. (TBA at first OT Rx visit) Baseline: max a Goal status: GOAL DISCHARGED. This tool no longer used to assess outcomes in our clinic   3.  With modified independence (extra time and assistive devices) Pt will be able to don and doff appropriate compression garments and/or devices to control BLE lymphedema and to limit progression.  Baseline: Dependent Goal status: ONGOING   4. Pt will achieve at least a 10% volume reductions bilaterally below the knees to return limb to more typical size and shape, to limit infection risk and LE progression, to decrease pain, to improve function, and to improve  body image and QOL. Baseline: Dependent Goal status: ONGOING   5. Pt will achieve and sustain at least 85% attendance at OT sessions, and with compliance with all LE self-care home program components throughout CDT, including modified simple self-MLD, daily skin care and inspection, lymphatic pumping the ex and appropriate compression to limit lymphedema progression and to limit further functional decline. Baseline: Dependent Goal status: GOAL MET and ongoing    PLAN:   OT FREQUENCY: q 2 weeks and PRN to reduce pain and chronic swelling to support ongoing self-management   OT DURATION: 18 weeks and PRN   PLANNED INTERVENTIONS:  Complete Decongestive Therapy (CDT) , intensive and self-management phases: elevation, therapeutic exercise, manual lymphatic drainage (MLD), skin care, COMPRESSION BANDAGING AND GARMENTS) Therapeutic activity, Patient/Family education, Self Care, DME instructions. ongoing  2. Pt should be fit with Custom-made gradient compression garments and HOS devices. These are medically necessary because they are uniquely sized and shaped to fit the exact dimensions of the affected extremities, and to provide appropriate medical grade, graduated compression essential for optimally managing chronic, progressive lymphedema. Multiple custom compression garments are needed to ensure proper hygiene to limit infection risk. Custom compression garments should be replaced q 3-6 months When worn consistently for optimal lipo-lymphedema self-management over time. HOS devices, medically necessary to limit fibrosis buildup in tissue, should be replaced q 2 years and PRN when worn out. 03/13/24: Garments on order  3. Replace basic sequential  vaso-pneumatic pump with advanced Flexitouch Plus sequential pneumatic compression device which includes a truncal component that mobilizes lip-lymphedema of the trunk, abdomen, hips and buttocks following lymphatic anatomy. The Flexitouch is medically  necessary to mobilize lymphedema above the level of  the groin nodes towards abdominal lymph nodes and the thoracic duct to reduce infection risk and decrease volume for optimal LE management over time at home. 03/13/24 Trial conducted 03/11/24. New insurance will not authorize per manufacturer rep's advice.   PLAN FOR NEXT SESSION:  MLD Pt edu re LE self care    Zebedee Dec, MS, OTR/L, CLT-LANA 03/13/24 9:04 AM

## 2024-03-23 ENCOUNTER — Telehealth: Payer: Self-pay

## 2024-03-23 NOTE — Telephone Encounter (Signed)
 Victoria Berry called triage stating she had one day of brown discharge and spotting, she stated if [name] could call her to possible discuss further treatment.

## 2024-03-24 NOTE — Telephone Encounter (Signed)
 Pls call pt to get more details. Hasn't been a yr wihtout bleeding/menopause, so this is not unusual. No treatment is needed. What are her concerns?

## 2024-03-25 ENCOUNTER — Ambulatory Visit: Attending: Nurse Practitioner | Admitting: Occupational Therapy

## 2024-03-25 DIAGNOSIS — I890 Lymphedema, not elsewhere classified: Secondary | ICD-10-CM | POA: Insufficient documentation

## 2024-03-25 NOTE — Therapy (Signed)
 OUTPATIENT OCCUPATIONAL THERAPY TREATMENT NOTE  BILATERAL LOWER EXTREMITY/ BILATERAL LOWER QUADRANT LIPO- LYMPHEDEMA  Patient Name: Victoria Berry MRN: [id number] DOB:1971/01/24, 53 y.o., female Today's Date: 03/25/2024  REPORTING PERIOD:   END OF SESSION:   OT End of Session - 03/25/24 1524     Visit Number 11    Number of Visits 36    Date for Recertification  05/12/24    OT Start Time 0310    OT Stop Time 0400    OT Time Calculation (min) 50 min    Activity Tolerance Patient tolerated treatment well;No increased pain    Behavior During Therapy WFL for tasks assessed/performed            Past Medical History:  Diagnosis Date   GERD (gastroesophageal reflux disease)    Lipidemia    Obesity    RA (rheumatoid arthritis) (HCC)    Past Surgical History:  Procedure Laterality Date   CESAREAN SECTION  2000   twins   COLONOSCOPY WITH PROPOFOL  N/A 06/24/2015   Procedure: COLONOSCOPY WITH PROPOFOL ;  Surgeon: Donnice Vaughn Manes, MD;  Location: [facility] ENDOSCOPY;  Service: Endoscopy;  Laterality: N/A;   ESOPHAGOGASTRODUODENOSCOPY (EGD) WITH PROPOFOL  N/A 06/24/2015   Procedure: ESOPHAGOGASTRODUODENOSCOPY (EGD) WITH PROPOFOL ;  Surgeon: Donnice Vaughn Manes, MD;  Location: [facility] ENDOSCOPY;  Service: Endoscopy;  Laterality: N/A;   right ureter     TONSILLECTOMY     Patient Active Problem List   Diagnosis Date Noted   Kidney cyst, acquired 03/14/2022   Intermittent palpitations 06/03/2019   Pulsatile tinnitus, right ear 12/25/2017   Anxiety state 01/16/2017   Major depression, melancholic type 01/16/2017   Acquired equinus deformity of both feet 10/25/2016   Chronic pain of right ankle 04/25/2016   Morbid obesity with BMI of 45.0-49.9, adult (HCC) 12/21/2015   CRP elevated 07/20/2015   Encounter for long-term (current) use of high-risk medication 04/20/2015   Mild obesity 04/20/2015   Rheumatoid arthritis of multiple sites with negative rheumatoid factor (HCC)  04/20/2015   Macular degeneration, dry 02/12/2014   GERD (gastroesophageal reflux disease) 11/27/2013    PCP: [name] Perfect, PA-C  REFERRING PROVIDER: Orvin Daring, NP  REFERRING DIAG: I89.0  THERAPY DIAG:  Lymphedema, not elsewhere classified  Rationale for Evaluation and Treatment: Rehabilitation  ONSET DATE: Lipedema onset at puberty (+ family hx on both sides)      Lymphedema onset pre menopause ~ 10 yrs ago (2015)  SUBJECTIVE:  SUBJECTIVE STATEMENT: Cedar Roseman presents to OT for treatment of BLE lipo-lymphedema. Pt reports pain, described as heaviness in both lower extremities. Pt She reports she continues to follow along with vascular specialist.  Pt is hopeful that she will be able to obtain an advanced Flexitouch device to assist her with long term lipo-lymphedema self management. She has a Biotab device , but she reports it hurts her feet and legs. [name] from [employer] is here today to assist Pt with a trial of the device.  PERTINENT HISTORY:  RA onset 2009 (+ family hx), Lipedema (reported), Obesity - induced obesity (BMI 37.9 today), varicose veins  PAIN:  Are you having pain? Yes: NPRS scale: 6/10 Pain location: knees distally, bilateral ankles Pain description: heaviness, fullness, fatigued Aggravating factors: extended standing and sitting-gravity dependent positioning Relieving factors: elevation, pumps (Biotab)  PRECAUTIONS: Other: LYMPHEDEMA PRECAUTIONS  WEIGHT BEARING RESTRICTIONS: No   FALLS:  Has patient fallen in last 6 months? No  LIVING ENVIRONMENT: Lives with: lives with their spouse Lives in: House/apartment Stairs: Yes; External: 4 steps; can reach both Has following equipment at home: None  OCCUPATION: Dental hygienist-full  time  [place]URE: beach  HAND DOMINANCE: right   PRIOR LEVEL OF FUNCTION: Independent  PATIENT GOALS:  1 Learn how to minimize swelling 2. Work on weight loss 3. Reduce limb volume 4. Reduce discomfort 5. Keep Lymphedema from getting worse  OBJECTIVE: Note: Objective measures were completed at Evaluation unless otherwise noted.  COGNITION:  Overall cognitive status: Within functional limits for tasks assessed   LYMPHEDEMA OBSERVATIONS / OTHER ASSESSMENTS:   POSTURE: WNL  LE ROM: WNL  BLE COMPARATIVE LIMB VOLUMETRICS  10/23/23 INITIAL  LANDMARK RIGHT  (dominant)  R LEG (A-D) 4045.1 ml  R THIGH (E-G) 7657.4 ml  R FULL LIMB (A-G) 11702.5 ml  Limb Volume differential (LVD)  %  Volume change since initial %  Volume change overall V  (Blank rows = not tested)  LANDMARK LEFT   L LEG (A-D) 4133.6 ml  L THIGH (E-G) 7492.7 ml  L FULL LIMB (A-G) 11676.2 ml  Limb Volume differential (LVD)  LEG Limb Volume Differential (LVD) measures 3.4%, L>R. THIGH LVD measures 2.2 %, R>L, and LVD for full limb measures 0.2%, R>L.   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   Skin  Description Hyper-Keratosis Peau d' Orange Shiny Tight Fibrotic/ Indurated Fatty Doughy Spongy/ boggy       x x x x   Skin dry Flaky WNL Macerated     x    Color Redness Varicosities Blanching Hemosiderin Stain Mottled        x   Odor Malodorous Yeast Fungal infection  WNL      x   Temperature Warm Cool wnl    x     Pitting Edema   1+ 2+ 3+ 4+ Non-pitting         x   Girth Symmetrical Asymmetrical                   Distribution   x  Lower quadrants bilaterally: toes to groin, abdomen, hips, and  buttocks    Stemmer Sign Positive Negative    x   Lymphorrhea History Of:  Present Absent     x    Wounds History Of Present Absent Venous Arterial Pressure Sheer     x  Signs of Infection  Redness Warmth Erythema Acute Swelling Drainage Borders                    Sensation Light Touch Deep pressure Hypersensitivity   In tact Impaired Present In tact Absent Impaired   x  x  x     Nails WNL   Fungus nail dystrophy   x     Hair Growth Symmetrical Asymmetrical   x    Skin Creases Base of toes  Ankles   Base of Fingers knees       Abdominal pannus Thigh Lobules  Face/neck    x    x      LYMPHEDEMA LIFE IMPACT SCALE (LLIS):52.94% (The extent to which LE-related problems impacted your life last week)   PATIENT EDUCATION:  Continued Pt/ CG edu for lymphedema self care home program throughout session. Topics include outcome of comparative limb volumetrics- starting limb volume differentials (LVDs), technology and gradient techniques used for short stretch, multilayer compression wrapping, simple self-MLD, therapeutic lymphatic pumping exercises, skin/nail care, LE precautions, compression garment recommendations and specifications, wear and care schedule and compression garment donning / doffing w assistive devices. Discussed progress towards all OT goals since commencing CDT. Discussed detrimental impact of obesity on lower and upper extremity lymphedema over time. Reviewed OT goals for lymphedema care with Pt and discussed progress to date.  All questions answered to the Pt's satisfaction. Good return. Person educated: Patient  Education method: Explanation, Demonstration, and Handouts Education comprehension: verbalized understanding, returned demonstration, verbal cues required, and needs further education   Pt/ family education for basic and advanced sequential pneumatic compression devices, or "pump", including clinical indications, how they work, how the basic device differs from the advanced, clinical indications, contraindications and precautions and care and use routines for pneumatic compression devices. Patient/ caregiver's questions were answered throughout  trial and handouts and Internet resources given for reference. FLEXITOUCH PRECAUTIONS: Pt education re precautions related to use of advanced sequential pneumatic compression device. Pt verbalized understanding that she should never use device on 2 arms/legs simultaneously and should not use device 2 x on same day to avoid overloading her heart with fluid volume return both at present, and in years to come should her medical condition change. Pt instructed to remove device     LYMPHEDEMA SELF-CARE HOME PROGRAM: BLE lymphatic pumping there ex- 1 set of 10 each element, in order. Hold 5. 2 x daily 2. Daily, short stretch, thigh length, multilayer compression bandages during Intensive Phase CDT-Deferred 3. During self-Management Phase appropriate thigh high compression stockings paired with compression biker shorts (medical grade TBD) 3. Daily skin care with low ph lotion matching skin ph 4. Daily simple self MLD     ASSESSMENT:   CLINICAL IMPRESSION: Continued MLD to LLE today as established without increased pain. Completed fitting assessment for custom knee length custom Elvarex compression garments that Pt received last week. Garments fit very well and Pt is very happy with them. OT let vendor know via email. Cont as per POC.  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  Pt has undergone skilled Occupational Therapy for conservative Complete Decongestive Therapy (CDT) 2 x monthly for 5 months with chronic swelling and associated pain persisting. Clinical CDT includes compression bandaging and/ or compression garments, therapeutic lymphatic pumping exercise, skin care to limit infection, manual lymphatic drainage (MLD) and elevation. Pt remains 100% compliant with  all self-management home program components. Ms. Ms Luellen-Conner has used a basic [phone])  Biotab sequential compression "pump" intermittently nearly 1 year. She no longer uses the device because it causes pain in her legs and feet. This basic device provides squeeze and release compression   from distal to proximal and does not address progressive lymphedema in the BLE, abdomen, buttocks and hips,  and associated pain and  tissue fibrosis. Today in clinic Pt completed a trial the advanced ([id number]) Flexitouch sequential pneumatic compression device in the clinic without pain today for a 1-hour using 30-50 mmHg). The Flexitouch is medically necessary to address this patient's chronic, progressive, RLE/RLQ lipo- lymphedema with swelling below the waist and throughout the lower quadrant. The Flexitouch is medically necessary to reduce limb volume and fibrosis, infection risk, and to optimally manage chronic, progressive lymphedema over time at home. )   Provided Pt education throughout session on differences between circular knit and flat knit, off the shelf and custom compression garments, importance of transitions at skin folds instead of bunching at the ankles. Discussed importance of correct fabric selection to accommodate and support lipo-lymphedema. Discussed measuring and fitting processes and role of DME provider. Pt asked good questions and demonstrated understanding of important issues to make informed choices about garment options matching her condition and lifestyle. After skilled education Pt and therapist completed appropriate specifications for medically necessary compression garments for optimal control of  chronic, progressive , Lipo-lymphedema. Custom-made gradient compression garments and HOS devices are medically necessary because they are uniquely sized and shaped to fit the exact dimensions of the affected extremities, and to provide appropriate medical grade, graduated compression essential for optimally managing chronic, progressive lymphedema. Multiple custom compression garments  are needed to ensure proper hygiene to limit infection risk. Custom compression garments should be replaced q 3-6 months When worn consistently for optimal lipo-lymphedema self-management over time. HOS devices, medically necessary to limit fibrosis buildup in tissue, should be replaced q 2 years and PRN when worn out.   Pt should be fit with:  QUANTITY 2 for each LEG: BLE, custom , Jobst, Elvarex Classic, flat knit ccl 2 ( 23-32 mmHg) knee length compression stockings with T heel. 2.5 cm wide silicone top band, tricot pocket at instep,and oblique top edge and open toe. QUANTITY 1 each leg: custom, knee length, ccl 2, Jobst, RELAX, convoluted hours of sleep device to limit re accumulation of stagnant fluid in limbs during rest , or HOS, to limit fibrosis formation, and to facilitate improved lymphatic and venous functional rest.    Pt donned off the shelf compression pantyhose after session. These are too long for her petite stature and do not control lipo-lymphedema. Cont as per POC.)  OBJECTIVE IMPAIRMENTS: decreased activity tolerance, decreased knowledge of condition, decreased knowledge of use of DME, decreased ROM, increased edema, obesity, pain, and chronic, progressive BLE BLQ swelling and associate pain.   ACTIVITY LIMITATIONS: impaired functional mobility and ambulation ( sitting, standing, squatting, stairs, and bed mobility). Impaired basic and instrumental ADLs ( extended standing, walking and or sitting to prep food, cook, perform shopping, housekeeping, to drive long distances, to fit shoes and lower body clothing, limits sleep), to perform productive activities ( work duties, caring others); Leg pain and swelling limits leisure pursuits  requiring extended sitting, standing and/ or walking; social participation in the community requiring extended standing, sitting and or walking; psychosocial domain: impacts clothing selection due to trying to disguise swelling, limits body image limits  intimacy w partner, contributes  to depression  PERSONAL FACTORS: Time since onset of injury/illness/exacerbation and 1-3 co morbidities: RA, varicose veins, Lipedema,  are also affecting patient's functional outcome.   REHAB POTENTIAL: Good  EVALUATION COMPLEXITY: Moderate  Goals reviewed with patient? Yes   SHORT TERM GOALS: Target date: 4th OT Rx visit    Pt will demonstrate understanding of lymphedema precautions and prevention strategies with modified independence using a printed reference to identify at least 5 precautions and discussing how s/he may implement them into daily life to reduce risk of progression with modified assistance Baseline: max a Goal status: GOAL MET   2. Pt will be able to apply multilayer, knee length, compression wraps using gradient techniques to decrease limb volume, to limit infection risk, and to limit lymphedema progression.   Baseline: Dependent Goal status:GOAL NOT MET to date. Compression wraps will not stay in place and fall down to knees.     LONG TERM GOALS: Target date: 06/10/24      Given this patient's Intake score of 52.94 % on the Lymphedema Life Impact Scale (LLIS), patient will experience a reduction of at least 10 points in the extent to which LE-related problems impact her daily life to improve functional performance and quality of life (QOL) Baseline:52.94% Goal status: ONGOING   2.  Given this patient's Intake score of TBA/100% on the functional outcomes FOTO tool, patient will experience an increase in function of 5 points to improve basic and instrumental ADLs performance, including lymphedema self-care. (TBA at first OT Rx visit) Baseline: max a Goal status: GOAL DISCHARGED. This tool no longer used to assess outcomes in our clinic   3.  With modified independence (extra time and assistive devices) Pt will be able to don and doff appropriate compression garments and/or devices to control BLE lymphedema and to limit progression.   Baseline: Dependent Goal status: ONGOING   4. Pt will achieve at least a 10% volume reductions bilaterally below the knees to return limb to more typical size and shape, to limit infection risk and LE progression, to decrease pain, to improve function, and to improve body image and QOL. Baseline: Dependent Goal status: ONGOING   5. Pt will achieve and sustain at least 85% attendance at OT sessions, and with compliance with all LE self-care home program components throughout CDT, including modified simple self-MLD, daily skin care and inspection, lymphatic pumping the ex and appropriate compression to limit lymphedema progression and to limit further functional decline. Baseline: Dependent Goal status: GOAL MET and ongoing    PLAN:   OT FREQUENCY: q 2 weeks and PRN to reduce pain and chronic swelling to support ongoing self-management   OT DURATION: 18 weeks and PRN   PLANNED INTERVENTIONS:  Complete Decongestive Therapy (CDT) , intensive and self-management phases: elevation, therapeutic exercise, manual lymphatic drainage (MLD), skin care, COMPRESSION BANDAGING AND GARMENTS) Therapeutic activity, Patient/Family education, Self Care, DME instructions. ongoing  2. Pt should be fit with Custom-made gradient compression garments and HOS devices. These are medically necessary because they are uniquely sized and shaped to fit the exact dimensions of the affected extremities, and to provide appropriate medical grade, graduated compression essential for optimally managing chronic, progressive lymphedema. Multiple custom compression garments are needed to ensure proper hygiene to limit infection risk. Custom compression garments should be replaced q 3-6 months When worn consistently for optimal lipo-lymphedema self-management over time. HOS devices, medically necessary to limit fibrosis buildup in tissue, should be replaced q 2 years and PRN when worn out. 03/13/24:  Garments on order  3. Replace  basic sequential  vaso-pneumatic pump with advanced Flexitouch Plus sequential pneumatic compression device which includes a truncal component that mobilizes lip-lymphedema of the trunk, abdomen, hips and buttocks following lymphatic anatomy. The Flexitouch is medically necessary to mobilize lymphedema above the level of the groin nodes towards abdominal lymph nodes and the thoracic duct to reduce infection risk and decrease volume for optimal LE management over time at home. 03/13/24 Trial conducted 03/11/24. New insurance will not authorize per manufacturer rep's advice.   PLAN FOR NEXT SESSION:  MLD Pt edu re LE self care    Zebedee Dec, MS, OTR/L, CLT-LANA 03/25/24 4:00 PM

## 2024-03-28 NOTE — Telephone Encounter (Signed)
 Pls let pt know bleeding could be from adding estrogen. If has frequent or excessive bleeding, then we will do an ultrasound. If has period type bleeding a month or so apart, then not considered abnormal and don't need ultrasound. She can also f/u with practice Rx her meds. Thx.

## 2024-03-30 NOTE — Telephone Encounter (Signed)
 Patient called back and is aware. Says she is not concerned, and she is no longer bleeding.

## 2024-04-08 ENCOUNTER — Encounter: Payer: Self-pay | Admitting: Occupational Therapy

## 2024-04-08 ENCOUNTER — Ambulatory Visit: Admitting: Occupational Therapy

## 2024-04-08 DIAGNOSIS — I890 Lymphedema, not elsewhere classified: Secondary | ICD-10-CM | POA: Diagnosis not present

## 2024-04-08 NOTE — Therapy (Signed)
 OUTPATIENT OCCUPATIONAL THERAPY TREATMENT NOTE  BILATERAL LOWER EXTREMITY/ BILATERAL LOWER QUADRANT LIPO- LYMPHEDEMA  Patient Name: Victoria Berry MRN: [id number] DOB:Jun 26, 1970, 53 y.o., female Today's Date: 04/08/2024  REPORTING PERIOD:   END OF SESSION:   OT End of Session - 04/08/24 1602     Visit Number 12    Number of Visits 36    Date for Recertification  05/12/24    OT Start Time 0300    OT Stop Time 0400    OT Time Calculation (min) 60 min    Activity Tolerance Patient tolerated treatment well;No increased pain    Behavior During Therapy WFL for tasks assessed/performed            Past Medical History:  Diagnosis Date   GERD (gastroesophageal reflux disease)    Lipidemia    Obesity    RA (rheumatoid arthritis) (HCC)    Past Surgical History:  Procedure Laterality Date   CESAREAN SECTION  2000   twins   COLONOSCOPY WITH PROPOFOL  N/A 06/24/2015   Procedure: COLONOSCOPY WITH PROPOFOL ;  Surgeon: Victoria Vaughn Manes, MD;  Location: [facility] ENDOSCOPY;  Service: Endoscopy;  Laterality: N/A;   ESOPHAGOGASTRODUODENOSCOPY (EGD) WITH PROPOFOL  N/A 06/24/2015   Procedure: ESOPHAGOGASTRODUODENOSCOPY (EGD) WITH PROPOFOL ;  Surgeon: Victoria Vaughn Manes, MD;  Location: [facility] ENDOSCOPY;  Service: Endoscopy;  Laterality: N/A;   right ureter     TONSILLECTOMY     Patient Active Problem List   Diagnosis Date Noted   Kidney cyst, acquired 03/14/2022   Intermittent palpitations 06/03/2019   Pulsatile tinnitus, right ear 12/25/2017   Anxiety state 01/16/2017   Major depression, melancholic type 01/16/2017   Acquired equinus deformity of both feet 10/25/2016   Chronic pain of right ankle 04/25/2016   Morbid obesity with BMI of 45.0-49.9, adult (HCC) 12/21/2015   CRP elevated 07/20/2015   Encounter for long-term (current) use of high-risk medication 04/20/2015   Mild obesity 04/20/2015   Rheumatoid arthritis of multiple sites with negative rheumatoid factor (HCC)  04/20/2015   Macular degeneration, dry 02/12/2014   GERD (gastroesophageal reflux disease) 11/27/2013    PCP: Victoria Perfect, PA-C  REFERRING PROVIDER: Orvin Daring, NP  REFERRING DIAG: I89.0  THERAPY DIAG:  Lymphedema, not elsewhere classified  Rationale for Evaluation and Treatment: Rehabilitation  ONSET DATE: Lipedema onset at puberty (+ family hx on both sides)      Lymphedema onset pre menopause ~ 10 yrs ago (2015)  SUBJECTIVE:  SUBJECTIVE STATEMENT: Victoria Berry presents to OT for treatment of BLE lipo-lymphedema. Pt reports pain, described as heaviness in both lower extremities. Pt She reports she continues to follow along with vascular specialist.  Pt is hopeful that she will be able to obtain an advanced Flexitouch device to assist her with long term lipo-lymphedema self management. She has a Biotab device , but she reports it hurts her feet and legs. Victoria Berry from [employer] is here today to assist Pt with a trial of the device.  PERTINENT HISTORY:  RA onset 2009 (+ family hx), Lipedema (reported), Obesity - induced obesity (BMI 37.9 today), varicose veins  PAIN:  Are you having pain? Yes: NPRS scale: 6/10 Pain location: knees distally, bilateral ankles Pain description: heaviness, fullness, fatigued Aggravating factors: extended standing and sitting-gravity dependent positioning Relieving factors: elevation, pumps (Biotab)  PRECAUTIONS: Other: LYMPHEDEMA PRECAUTIONS  WEIGHT BEARING RESTRICTIONS: No   FALLS:  Has patient fallen in last 6 months? No  LIVING ENVIRONMENT: Lives with: lives with their spouse Lives in: House/apartment Stairs: Yes; External: 4 steps; can reach both Has following equipment at home: None  OCCUPATION: Dental hygienist-full  time  [place]URE: beach  HAND DOMINANCE: right   PRIOR LEVEL OF FUNCTION: Independent  PATIENT GOALS:  1 Learn how to minimize swelling 2. Work on weight loss 3. Reduce limb volume 4. Reduce discomfort 5. Keep Lymphedema from getting worse  OBJECTIVE: Note: Objective measures were completed at Evaluation unless otherwise noted.  COGNITION:  Overall cognitive status: Within functional limits for tasks assessed   LYMPHEDEMA OBSERVATIONS / OTHER ASSESSMENTS:   POSTURE: WNL  LE ROM: WNL  BLE COMPARATIVE LIMB VOLUMETRICS  10/23/23 INITIAL  LANDMARK RIGHT  (dominant)  R LEG (A-D) 4045.1 ml  R THIGH (E-G) 7657.4 ml  R FULL LIMB (A-G) 11702.5 ml  Limb Volume differential (LVD)  %  Volume change since initial %  Volume change overall V  (Blank rows = not tested)  LANDMARK LEFT   L LEG (A-D) 4133.6 ml  L THIGH (E-G) 7492.7 ml  L FULL LIMB (A-G) 11676.2 ml  Limb Volume differential (LVD)  LEG Limb Volume Differential (LVD) measures 3.4%, L>R. THIGH LVD measures 2.2 %, R>L, and LVD for full limb measures 0.2%, R>L.   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   Skin  Description Hyper-Keratosis Peau d' Orange Shiny Tight Fibrotic/ Indurated Fatty Doughy Spongy/ boggy       x x x x   Skin dry Flaky WNL Macerated     x    Color Redness Varicosities Blanching Hemosiderin Stain Mottled        x   Odor Malodorous Yeast Fungal infection  WNL      x   Temperature Warm Cool wnl    x     Pitting Edema   1+ 2+ 3+ 4+ Non-pitting         x   Girth Symmetrical Asymmetrical                   Distribution   x  Lower quadrants bilaterally: toes to groin, abdomen, hips, and  buttocks    Stemmer Sign Positive Negative    x   Lymphorrhea History Of:  Present Absent     x    Wounds History Of Present Absent Venous Arterial Pressure Sheer     x  Signs of Infection  Redness Warmth Erythema Acute Swelling Drainage Borders                    Sensation Light Touch Deep pressure Hypersensitivity   In tact Impaired Present In tact Absent Impaired   x  x  x     Nails WNL   Fungus nail dystrophy   x     Hair Growth Symmetrical Asymmetrical   x    Skin Creases Base of toes  Ankles   Base of Fingers knees       Abdominal pannus Thigh Lobules  Face/neck    x    x      LYMPHEDEMA LIFE IMPACT SCALE (LLIS):52.94% (The extent to which LE-related problems impacted your life last week)   PATIENT EDUCATION:  Continued Pt/ CG edu for lymphedema self care home program throughout session. Topics include outcome of comparative limb volumetrics- starting limb volume differentials (LVDs), technology and gradient techniques used for short stretch, multilayer compression wrapping, simple self-MLD, therapeutic lymphatic pumping exercises, skin/nail care, LE precautions, compression garment recommendations and specifications, wear and care schedule and compression garment donning / doffing w assistive devices. Discussed progress towards all OT goals since commencing CDT. Discussed detrimental impact of obesity on lower and upper extremity lymphedema over time. Reviewed OT goals for lymphedema care with Pt and discussed progress to date.  All questions answered to the Pt's satisfaction. Good return. Person educated: Patient  Education method: Explanation, Demonstration, and Handouts Education comprehension: verbalized understanding, returned demonstration, verbal cues required, and needs further education   Pt/ family education for basic and advanced sequential pneumatic compression devices, or pump, including clinical indications, how they work, how the basic device differs from the advanced, clinical indications, contraindications and precautions and care and use routines for pneumatic compression devices. Patient/ caregiver's questions were answered throughout  trial and handouts and Internet resources given for reference. FLEXITOUCH PRECAUTIONS: Pt education re precautions related to use of advanced sequential pneumatic compression device. Pt verbalized understanding that she should never use device on 2 arms/legs simultaneously and should not use device 2 x on same day to avoid overloading her heart with fluid volume return both at present, and in years to come should her medical condition change. Pt instructed to remove device     LYMPHEDEMA SELF-CARE HOME PROGRAM: BLE lymphatic pumping there ex- 1 set of 10 each element, in order. Hold 5. 2 x daily 2. Daily, short stretch, thigh length, multilayer compression bandages during Intensive Phase CDT-Deferred 3. During self-Management Phase appropriate thigh high compression stockings paired with compression biker shorts (medical grade TBD) 3. Daily skin care with low ph lotion matching skin ph 4. Daily simple self MLD     ASSESSMENT:   CLINICAL IMPRESSION: Continued MLD to LLE today as established without increased pain. Completed fitting assessment for custom knee length custom Elvarex compression garments that Pt received last week. Garments fit very well and Pt is very happy with them.  Cont as per POC.  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  Pt has undergone skilled Occupational Therapy for conservative Complete Decongestive Therapy (CDT) 2 x monthly for 5 months with chronic swelling and associated pain persisting. Clinical CDT includes compression bandaging and/ or compression garments, therapeutic lymphatic pumping exercise, skin care to limit infection, manual lymphatic drainage (MLD) and elevation. Pt remains 100% compliant with all self-management home program components.  Ms. Ms Luellen-Conner has used a basic [phone]) Biotab sequential compression  pump intermittently nearly 1 year. She no longer uses the device because it causes pain in her legs and feet. This basic device provides squeeze and release compression   from distal to proximal and does not address progressive lymphedema in the BLE, abdomen, buttocks and hips,  and associated pain and  tissue fibrosis. Today in clinic Pt completed a trial the advanced ([id number]) Flexitouch sequential pneumatic compression device in the clinic without pain today for a 1-hour using 30-50 mmHg). The Flexitouch is medically necessary to address this patient's chronic, progressive, RLE/RLQ lipo- lymphedema with swelling below the waist and throughout the lower quadrant. The Flexitouch is medically necessary to reduce limb volume and fibrosis, infection risk, and to optimally manage chronic, progressive lymphedema over time at home. )   Provided Pt education throughout session on differences between circular knit and flat knit, off the shelf and custom compression garments, importance of transitions at skin folds instead of bunching at the ankles. Discussed importance of correct fabric selection to accommodate and support lipo-lymphedema. Discussed measuring and fitting processes and role of DME provider. Pt asked good questions and demonstrated understanding of important issues to make informed choices about garment options matching her condition and lifestyle. After skilled education Pt and therapist completed appropriate specifications for medically necessary compression garments for optimal control of  chronic, progressive , Lipo-lymphedema. Custom-made gradient compression garments and HOS devices are medically necessary because they are uniquely sized and shaped to fit the exact dimensions of the affected extremities, and to provide appropriate medical grade, graduated compression essential for optimally managing chronic, progressive lymphedema. Multiple custom compression garments are needed to ensure proper  hygiene to limit infection risk. Custom compression garments should be replaced q 3-6 months When worn consistently for optimal lipo-lymphedema self-management over time. HOS devices, medically necessary to limit fibrosis buildup in tissue, should be replaced q 2 years and PRN when worn out.   Pt should be fit with:  QUANTITY 2 for each LEG: BLE, custom , Jobst, Elvarex Classic, flat knit ccl 2 ( 23-32 mmHg) knee length compression stockings with T heel. 2.5 cm wide silicone top band, tricot pocket at instep,and oblique top edge and open toe. QUANTITY 1 each leg: custom, knee length, ccl 2, Jobst, RELAX, convoluted hours of sleep device to limit re accumulation of stagnant fluid in limbs during rest , or HOS, to limit fibrosis formation, and to facilitate improved lymphatic and venous functional rest.    Pt donned off the shelf compression pantyhose after session. These are too long for her petite stature and do not control lipo-lymphedema. Cont as per POC.)  OBJECTIVE IMPAIRMENTS: decreased activity tolerance, decreased knowledge of condition, decreased knowledge of use of DME, decreased ROM, increased edema, obesity, pain, and chronic, progressive BLE BLQ swelling and associate pain.   ACTIVITY LIMITATIONS: impaired functional mobility and ambulation ( sitting, standing, squatting, stairs, and bed mobility). Impaired basic and instrumental ADLs ( extended standing, walking and or sitting to prep food, cook, perform shopping, housekeeping, to drive long distances, to fit shoes and lower body clothing, limits sleep), to perform productive activities ( work duties, caring others); Leg pain and swelling limits leisure pursuits  requiring extended sitting, standing and/ or walking; social participation in the community requiring extended standing, sitting and or walking; psychosocial domain: impacts clothing selection due to trying to disguise swelling, limits body image limits intimacy w partner, contributes  to depression  PERSONAL FACTORS:  Time since onset of injury/illness/exacerbation and 1-3 co morbidities: RA, varicose veins, Lipedema,  are also affecting patient's functional outcome.   REHAB POTENTIAL: Good  EVALUATION COMPLEXITY: Moderate  Goals reviewed with patient? Yes   SHORT TERM GOALS: Target date: 4th OT Rx visit    Pt will demonstrate understanding of lymphedema precautions and prevention strategies with modified independence using a printed reference to identify at least 5 precautions and discussing how s/he may implement them into daily life to reduce risk of progression with modified assistance Baseline: max a Goal status: GOAL MET   2. Pt will be able to apply multilayer, knee length, compression wraps using gradient techniques to decrease limb volume, to limit infection risk, and to limit lymphedema progression.   Baseline: Dependent Goal status:GOAL NOT MET to date. Compression wraps will not stay in place and fall down to knees.     LONG TERM GOALS: Target date: 06/10/24      Given this patient's Intake score of 52.94 % on the Lymphedema Life Impact Scale (LLIS), patient will experience a reduction of at least 10 points in the extent to which LE-related problems impact her daily life to improve functional performance and quality of life (QOL) Baseline:52.94% Goal status: ONGOING   2.  Given this patient's Intake score of TBA/100% on the functional outcomes FOTO tool, patient will experience an increase in function of 5 points to improve basic and instrumental ADLs performance, including lymphedema self-care. (TBA at first OT Rx visit) Baseline: max a Goal status: GOAL DISCHARGED. This tool no longer used to assess outcomes in our clinic   3.  With modified independence (extra time and assistive devices) Pt will be able to don and doff appropriate compression garments and/or devices to control BLE lymphedema and to limit progression.  Baseline: Dependent Goal status:  ONGOING   4. Pt will achieve at least a 10% volume reductions bilaterally below the knees to return limb to more typical size and shape, to limit infection risk and LE progression, to decrease pain, to improve function, and to improve body image and QOL. Baseline: Dependent Goal status: ONGOING   5. Pt will achieve and sustain at least 85% attendance at OT sessions, and with compliance with all LE self-care home program components throughout CDT, including modified simple self-MLD, daily skin care and inspection, lymphatic pumping the ex and appropriate compression to limit lymphedema progression and to limit further functional decline. Baseline: Dependent Goal status: GOAL MET and ongoing    PLAN:   OT FREQUENCY: q 2 weeks and PRN to reduce pain and chronic swelling to support ongoing self-management   OT DURATION: 18 weeks and PRN   PLANNED INTERVENTIONS:  Complete Decongestive Therapy (CDT) , intensive and self-management phases: elevation, therapeutic exercise, manual lymphatic drainage (MLD), skin care, COMPRESSION BANDAGING AND GARMENTS) Therapeutic activity, Patient/Family education, Self Care, DME instructions. ongoing  2. Pt should be fit with Custom-made gradient compression garments and HOS devices. These are medically necessary because they are uniquely sized and shaped to fit the exact dimensions of the affected extremities, and to provide appropriate medical grade, graduated compression essential for optimally managing chronic, progressive lymphedema. Multiple custom compression garments are needed to ensure proper hygiene to limit infection risk. Custom compression garments should be replaced q 3-6 months When worn consistently for optimal lipo-lymphedema self-management over time. HOS devices, medically necessary to limit fibrosis buildup in tissue, should be replaced q 2 years and PRN when worn out. 03/13/24: Garments on order  3.  Replace basic sequential  vaso-pneumatic pump  with advanced Flexitouch Plus sequential pneumatic compression device which includes a truncal component that mobilizes lip-lymphedema of the trunk, abdomen, hips and buttocks following lymphatic anatomy. The Flexitouch is medically necessary to mobilize lymphedema above the level of the groin nodes towards abdominal lymph nodes and the thoracic duct to reduce infection risk and decrease volume for optimal LE management over time at home. 03/13/24 Trial conducted 03/11/24. New insurance will not authorize per manufacturer rep's advice.   PLAN FOR NEXT SESSION:  MLD Pt edu re LE self care    Zebedee Dec, MS, OTR/L, CLT-LANA 04/08/2024 4:03 PM

## 2024-04-20 NOTE — Progress Notes (Unsigned)
 " [facility] [address] [place] [address] Phone: [phone] Subjective:   [facility], am serving as a scribe for Dr. Arthea Claudene.  I'm seeing this patient by the request  of:  Copland, [name] NOVAK, PA-C  CC: Foot pain.  YEP:Dlagzrupcz  Victoria Berry is a 53 y.o. female coming in with complaint of R ankle pain. Has been doing PT for her foot. Patient states that over 20 years she has dealt with plantar fasciitis. Notices changes in foot over the past few years. Went to PT who did help her with heel pain. Does wear custom orthotics. Was using Altra's but switched to a different brand. Wants to know if foot is caused from knee or hip issue. Feels unstable in R ankle. Pain worse after she has been sitting. Also suffers from lymphedema.   Reviewing patient's chart has had this for quite some time.  Been seen in occupational therapy for more of bilateral lower extremity lymphedema.  Patient has been seeing a rheumatologist for seronegative rheumatoid arthritis.  On Plaquenil.  MRI of the right ankle in 2020 showed unfortunately some erosive changes consistent with rheumatoid arthritis of the mid and hindfoot.  At that point also had a split tear of the peroneal brevis tendon.  Interstitial tearing noted of the Achilles tendon.    Past Medical History:  Diagnosis Date   GERD (gastroesophageal reflux disease)    Lipidemia    Obesity    RA (rheumatoid arthritis) (HCC)    Past Surgical History:  Procedure Laterality Date   CESAREAN SECTION  2000   twins   COLONOSCOPY WITH PROPOFOL  N/A 06/24/2015   Procedure: COLONOSCOPY WITH PROPOFOL ;  Surgeon: Donnice Vaughn Manes, MD;  Location: [facility] ENDOSCOPY;  Service: Endoscopy;  Laterality: N/A;   ESOPHAGOGASTRODUODENOSCOPY (EGD) WITH PROPOFOL  N/A 06/24/2015   Procedure: ESOPHAGOGASTRODUODENOSCOPY (EGD) WITH PROPOFOL ;  Surgeon: Donnice Vaughn Manes, MD;  Location: [facility] ENDOSCOPY;  Service: Endoscopy;   Laterality: N/A;   right ureter     TONSILLECTOMY     Social History   Socioeconomic History   Marital status: Married    Spouse name: Not on file   Number of children: Not on file   Years of education: Not on file   Highest education level: Not on file  Occupational History   Not on file  Tobacco Use   Smoking status: Never   Smokeless tobacco: Never  Vaping Use   Vaping status: Never Used  Substance and Sexual Activity   Alcohol use: No   Drug use: No   Sexual activity: Yes    Birth control/protection: Condom  Other Topics Concern   Not on file  Social History Narrative   Not on file   Social Drivers of Health   Tobacco Use: Low Risk (04/08/2024)   Patient History    Smoking Tobacco Use: Never    Smokeless Tobacco Use: Never    Passive Exposure: Not on file  Financial Resource Strain: Not on file  Food Insecurity: Not on file  Transportation Needs: Not on file  Physical Activity: Not on file  Stress: Not on file  Social Connections: Not on file  Depression (EYV7-0): Not on file  Alcohol Screen: Not on file  Housing: Unknown (10/23/2023)   Received from [facility]   Epic    At any time in the past 12 months, were you homeless or living in a shelter (including now)?: No    Number of Times  Moved in the Last Year: Not on file    Unable to Pay for Housing in the Last Year: Not on file  Utilities: Not on file  Health Literacy: Not on file   Allergies[1] Family History  Problem Relation Age of Onset   Rheum arthritis Mother    Lung cancer Paternal Grandfather    Lung cancer Father 20   Kidney cancer Maternal Grandmother 86   Breast cancer Neg Hx     Current Outpatient Medications (Endocrine & Metabolic):    progesterone  (PROMETRIUM ) 100 MG capsule, Take 1 capsule (100 mg total) by mouth daily.  Current Outpatient Medications (Other):    cholecalciferol (VITAMIN D) 1000 UNITS tablet, Take 1,000 Units by mouth daily.   hydroxychloroquine  (PLAQUENIL) 200 MG tablet, Take 200 mg by mouth 2 (two) times daily.   MAGNESIUM PO, Take by mouth.   Multiple Vitamins-Minerals (PRESERVISION AREDS 2 PO), Take by mouth.   Omega-3 Fatty Acids (OMEGA-3 FISH OIL PO), Take by mouth.   Reviewed prior external information including notes and imaging from  primary care provider As well as notes that were available from care everywhere and other healthcare systems.  Past medical history, social, surgical and family history all reviewed in electronic medical record.  No pertanent information unless stated regarding to the chief complaint.   Review of Systems:  No headache, visual changes, nausea, vomiting, diarrhea, constipation, dizziness, abdominal pain, skin rash, fevers, chills, night sweats, weight loss, swollen lymph nodes, body aches, joint swelling, chest pain, shortness of breath, mood changes. POSITIVE muscle aches  Objective  Blood pressure 122/84, pulse 68, height 5' (1.524 m), weight 200 lb (90.7 kg), SpO2 100%.   General: No apparent distress alert and oriented x3 mood and affect normal, dressed appropriately.  HEENT: Pupils equal, extraocular movements intact  Respiratory: Patient's speak in full sentences and does not appear short of breath  Cardiovascular: No lower extremity edema, non tender, no erythema  Foot exam shows pes cavus of the foot.  Patient does have some breakdown with a narrow midfoot noted.  Patient though does have significant limitation in range of motion of the ankle.  Limited muscular skeletal ultrasound was performed and interpreted by CLAUDENE HUSSAR, M  Limited ultrasound of the patient's ankle shows significant narrowing with hypoechoic changes coming from the ankle mortise itself medial more than lateral.  Seems to have significant increase in Doppler flow consistent with acute inflammation.  No sign of any infectious etiology.  The lipoprotein in the soft tissue noted consistent with her Lipo  edema. Impression: Ankle arthritis  Procedure: Real-time Ultrasound Guided Injection of right ankle mortise Device: GE Logiq Q7 Ultrasound guided injection is preferred based studies that show increased duration, increased effect, greater accuracy, decreased procedural pain, increased response rate, and decreased cost with ultrasound guided versus blind injection.  Verbal informed consent obtained.  Time-out conducted.  Noted no overlying erythema, induration, or other signs of local infection.  Skin prepped in a sterile fashion.  Local anesthesia: Topical Ethyl chloride.  With sterile technique and under real time ultrasound guidance: With a 25-gauge half inch needle injected with 0.5 cc of 0.5% Marcaine and 0.5 cc of Kenalog 40 mg/mL this was from the medial approach Completed without difficulty  Pain immediately resolved suggesting accurate placement of the medication.  Advised to call if fevers/chills, erythema, induration, drainage, or persistent bleeding.  Impression: Technically successful ultrasound guided injection.    Impression and Recommendations:     The above documentation has  been reviewed and is accurate and complete Arthea CHRISTELLA Sharps, DO       [1]  Allergies Allergen Reactions   Penicillins Nausea And Vomiting and Other (See Comments)    Had it as a child Had it as a child Had it as a child Had it as a child   "

## 2024-04-22 ENCOUNTER — Encounter: Payer: Self-pay | Admitting: Family Medicine

## 2024-04-22 ENCOUNTER — Ambulatory Visit: Admitting: Family Medicine

## 2024-04-22 ENCOUNTER — Other Ambulatory Visit: Payer: Self-pay

## 2024-04-22 VITALS — BP 122/84 | HR 68 | Ht 60.0 in | Wt 200.0 lb

## 2024-04-22 DIAGNOSIS — M79671 Pain in right foot: Secondary | ICD-10-CM | POA: Diagnosis not present

## 2024-04-22 DIAGNOSIS — M25571 Pain in right ankle and joints of right foot: Secondary | ICD-10-CM | POA: Diagnosis not present

## 2024-04-22 DIAGNOSIS — G8929 Other chronic pain: Secondary | ICD-10-CM | POA: Diagnosis not present

## 2024-04-22 NOTE — Patient Instructions (Addendum)
 Injected foot today Tart cherry 3600mg  at night of B12 100mg  of B6 See me again in 2 months

## 2024-04-22 NOTE — Assessment & Plan Note (Signed)
 Given injection today, tolerated the procedure well, discussed icing regimen and home exercises, but I do see some erosive changes.  Do think that this is fairly significant.  Will try different shoes, over-the-counter orthotics, Spenco orthotics total support online would be great   Recovery sandals in the house.  Hopefully injection does help out significantly.  Could be a candidate for PRP.  Follow-up again in 6 to 8 weeks otherwise.

## 2024-05-06 ENCOUNTER — Ambulatory Visit: Attending: Nurse Practitioner | Admitting: Occupational Therapy

## 2024-05-06 ENCOUNTER — Encounter: Payer: Self-pay | Admitting: Occupational Therapy

## 2024-05-06 ENCOUNTER — Ambulatory Visit: Admitting: Family Medicine

## 2024-05-06 DIAGNOSIS — I890 Lymphedema, not elsewhere classified: Secondary | ICD-10-CM | POA: Diagnosis present

## 2024-05-06 NOTE — Therapy (Signed)
 " OUTPATIENT OCCUPATIONAL THERAPY TREATMENT NOTE  BILATERAL LOWER EXTREMITY/ BILATERAL LOWER QUADRANT LIPO- LYMPHEDEMA  Patient Name: Victoria Berry MRN: [id number] DOB:10/02/1970, 54 y.o., female Today's Date: 05/06/2024  REPORTING PERIOD:   END OF SESSION:   OT End of Session - 05/06/24 1508     Visit Number 13    Number of Visits 36    Date for Recertification  05/12/24    OT Start Time 0300    OT Stop Time 0400    OT Time Calculation (min) 60 min    Activity Tolerance Patient tolerated treatment well;No increased pain    Behavior During Therapy WFL for tasks assessed/performed            Past Medical History:  Diagnosis Date   GERD (gastroesophageal reflux disease)    Lipidemia    Obesity    RA (rheumatoid arthritis) (HCC)    Past Surgical History:  Procedure Laterality Date   CESAREAN SECTION  2000   twins   COLONOSCOPY WITH PROPOFOL  N/A 06/24/2015   Procedure: COLONOSCOPY WITH PROPOFOL ;  Surgeon: Donnice Vaughn Manes, MD;  Location: [facility] ENDOSCOPY;  Service: Endoscopy;  Laterality: N/A;   ESOPHAGOGASTRODUODENOSCOPY (EGD) WITH PROPOFOL  N/A 06/24/2015   Procedure: ESOPHAGOGASTRODUODENOSCOPY (EGD) WITH PROPOFOL ;  Surgeon: Donnice Vaughn Manes, MD;  Location: [facility] ENDOSCOPY;  Service: Endoscopy;  Laterality: N/A;   right ureter     TONSILLECTOMY     Patient Active Problem List   Diagnosis Date Noted   Kidney cyst, acquired 03/14/2022   Intermittent palpitations 06/03/2019   Pulsatile tinnitus, right ear 12/25/2017   Anxiety state 01/16/2017   Major depression, melancholic type 01/16/2017   Acquired equinus deformity of both feet 10/25/2016   Chronic pain of right ankle 04/25/2016   Morbid obesity with BMI of 45.0-49.9, adult (HCC) 12/21/2015   CRP elevated 07/20/2015   Encounter for long-term (current) use of high-risk medication 04/20/2015   Mild obesity 04/20/2015   Rheumatoid arthritis of multiple sites with negative rheumatoid factor (HCC)  04/20/2015   Macular degeneration, dry 02/12/2014   GERD (gastroesophageal reflux disease) 11/27/2013    PCP: [name] Perfect, PA-C  REFERRING PROVIDER: Orvin Daring, NP  REFERRING DIAG: I89.0  THERAPY DIAG:  Lymphedema, not elsewhere classified  Rationale for Evaluation and Treatment: Rehabilitation  ONSET DATE: Lipedema onset at puberty (+ family hx on both sides)      Lymphedema onset pre menopause ~ 10 yrs ago (2015)  SUBJECTIVE:  SUBJECTIVE STATEMENT: Andersyn Fragoso presents to OT for treatment of BLE lipo-lymphedema. Pt reports pain, described as heaviness in both lower extremities. Pt She reports she continues to follow along with vascular specialist.  Pt is hopeful that she will be able to obtain an advanced Flexitouch device to assist her with long term lipo-lymphedema self management. She has a Biotab device , but she reports it hurts her feet and legs. [name] from [employer] is here today to assist Pt with a trial of the device.  PERTINENT HISTORY:  RA onset 2009 (+ family hx), Lipedema (reported), Obesity - induced obesity (BMI 37.9 today), varicose veins  PAIN:  Are you having pain? Yes: NPRS scale: 6/10 Pain location: knees distally, bilateral ankles Pain description: heaviness, fullness, fatigued Aggravating factors: extended standing and sitting-gravity dependent positioning Relieving factors: elevation, pumps (Biotab)  PRECAUTIONS: Other: LYMPHEDEMA PRECAUTIONS  WEIGHT BEARING RESTRICTIONS: No   FALLS:  Has patient fallen in last 6 months? No  LIVING ENVIRONMENT: Lives with: lives with their spouse Lives in: House/apartment Stairs: Yes; External: 4 steps; can reach both Has following equipment at home: None  OCCUPATION: Dental hygienist-full  time  [place]URE: beach  HAND DOMINANCE: right   PRIOR LEVEL OF FUNCTION: Independent  PATIENT GOALS:  1 Learn how to minimize swelling 2. Work on weight loss 3. Reduce limb volume 4. Reduce discomfort 5. Keep Lymphedema from getting worse  OBJECTIVE: Note: Objective measures were completed at Evaluation unless otherwise noted.  COGNITION:  Overall cognitive status: Within functional limits for tasks assessed   LYMPHEDEMA OBSERVATIONS / OTHER ASSESSMENTS:   POSTURE: WNL  LE ROM: WNL  BLE COMPARATIVE LIMB VOLUMETRICS  10/23/23 INITIAL  LANDMARK RIGHT  (dominant)  R LEG (A-D) 4045.1 ml  R THIGH (E-G) 7657.4 ml  R FULL LIMB (A-G) 11702.5 ml  Limb Volume differential (LVD)  %  Volume change since initial %  Volume change overall V  (Blank rows = not tested)  LANDMARK LEFT   L LEG (A-D) 4133.6 ml  L THIGH (E-G) 7492.7 ml  L FULL LIMB (A-G) 11676.2 ml  Limb Volume differential (LVD)  LEG Limb Volume Differential (LVD) measures 3.4%, L>R. THIGH LVD measures 2.2 %, R>L, and LVD for full limb measures 0.2%, R>L.   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   Skin  Description Hyper-Keratosis Peau d' Orange Shiny Tight Fibrotic/ Indurated Fatty Doughy Spongy/ boggy       x x x x   Skin dry Flaky WNL Macerated     x    Color Redness Varicosities Blanching Hemosiderin Stain Mottled        x   Odor Malodorous Yeast Fungal infection  WNL      x   Temperature Warm Cool wnl    x     Pitting Edema   1+ 2+ 3+ 4+ Non-pitting         x   Girth Symmetrical Asymmetrical                   Distribution   x  Lower quadrants bilaterally: toes to groin, abdomen, hips, and  buttocks    Stemmer Sign Positive Negative    x   Lymphorrhea History Of:  Present Absent     x    Wounds History Of Present Absent Venous Arterial Pressure Sheer     x  Signs of Infection  Redness Warmth Erythema Acute Swelling Drainage Borders                    Sensation Light Touch Deep pressure Hypersensitivity   In tact Impaired Present In tact Absent Impaired   x  x  x     Nails WNL   Fungus nail dystrophy   x     Hair Growth Symmetrical Asymmetrical   x    Skin Creases Base of toes  Ankles   Base of Fingers knees       Abdominal pannus Thigh Lobules  Face/neck    x    x      LYMPHEDEMA LIFE IMPACT SCALE (LLIS):52.94% (The extent to which LE-related problems impacted your life last week)   PATIENT EDUCATION:  Continued Pt/ CG edu for lymphedema self care home program throughout session. Topics include outcome of comparative limb volumetrics- starting limb volume differentials (LVDs), technology and gradient techniques used for short stretch, multilayer compression wrapping, simple self-MLD, therapeutic lymphatic pumping exercises, skin/nail care, LE precautions, compression garment recommendations and specifications, wear and care schedule and compression garment donning / doffing w assistive devices. Discussed progress towards all OT goals since commencing CDT. Discussed detrimental impact of obesity on lower and upper extremity lymphedema over time. Reviewed OT goals for lymphedema care with Pt and discussed progress to date.  All questions answered to the Pt's satisfaction. Good return. Person educated: Patient  Education method: Explanation, Demonstration, and Handouts Education comprehension: verbalized understanding, returned demonstration, verbal cues required, and needs further education   Pt/ family education for basic and advanced sequential pneumatic compression devices, or pump, including clinical indications, how they work, how the basic device differs from the advanced, clinical indications, contraindications and precautions and care and use routines for pneumatic compression devices. Patient/ caregiver's questions were answered throughout  trial and handouts and Internet resources given for reference. FLEXITOUCH PRECAUTIONS: Pt education re precautions related to use of advanced sequential pneumatic compression device. Pt verbalized understanding that she should never use device on 2 arms/legs simultaneously and should not use device 2 x on same day to avoid overloading her heart with fluid volume return both at present, and in years to come should her medical condition change. Pt instructed to remove device     LYMPHEDEMA SELF-CARE HOME PROGRAM: BLE lymphatic pumping there ex- 1 set of 10 each element, in order. Hold 5. 2 x daily 2. Daily, short stretch, thigh length, multilayer compression bandages during Intensive Phase CDT-Deferred 3. During self-Management Phase appropriate thigh high compression stockings paired with compression biker shorts (medical grade TBD) 3. Daily skin care with low ph lotion matching skin ph 4. Daily simple self MLD     ASSESSMENT:   CLINICAL IMPRESSION: Continued MLD to LLE today as established without increased pain.Limb volume is stable and condition is unchanged since last visit. Cont as per POC.  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  Pt has undergone skilled Occupational Therapy for conservative Complete Decongestive Therapy (CDT) 2 x monthly for 5 months with chronic swelling and associated pain persisting. Clinical CDT includes compression bandaging and/ or compression garments, therapeutic lymphatic pumping exercise, skin care to limit infection, manual lymphatic drainage (MLD) and elevation. Pt remains 100% compliant with all self-management home program components. Ms. Ms Luellen-Conner has used a basic [phone]) Biotab sequential compression pump intermittently nearly 1 year. She no  longer uses the device because it causes pain in her legs and  feet. This basic device provides squeeze and release compression   from distal to proximal and does not address progressive lymphedema in the BLE, abdomen, buttocks and hips,  and associated pain and  tissue fibrosis. Today in clinic Pt completed a trial the advanced ([id number]) Flexitouch sequential pneumatic compression device in the clinic without pain today for a 1-hour using 30-50 mmHg). The Flexitouch is medically necessary to address this patient's chronic, progressive, RLE/RLQ lipo- lymphedema with swelling below the waist and throughout the lower quadrant. The Flexitouch is medically necessary to reduce limb volume and fibrosis, infection risk, and to optimally manage chronic, progressive lymphedema over time at home. )   Provided Pt education throughout session on differences between circular knit and flat knit, off the shelf and custom compression garments, importance of transitions at skin folds instead of bunching at the ankles. Discussed importance of correct fabric selection to accommodate and support lipo-lymphedema. Discussed measuring and fitting processes and role of DME provider. Pt asked good questions and demonstrated understanding of important issues to make informed choices about garment options matching her condition and lifestyle. After skilled education Pt and therapist completed appropriate specifications for medically necessary compression garments for optimal control of  chronic, progressive , Lipo-lymphedema. Custom-made gradient compression garments and HOS devices are medically necessary because they are uniquely sized and shaped to fit the exact dimensions of the affected extremities, and to provide appropriate medical grade, graduated compression essential for optimally managing chronic, progressive lymphedema. Multiple custom compression garments are needed to ensure proper hygiene to limit infection risk. Custom compression garments should be replaced q 3-6 months When worn  consistently for optimal lipo-lymphedema self-management over time. HOS devices, medically necessary to limit fibrosis buildup in tissue, should be replaced q 2 years and PRN when worn out.   Pt should be fit with:  QUANTITY 2 for each LEG: BLE, custom , Jobst, Elvarex Classic, flat knit ccl 2 ( 23-32 mmHg) knee length compression stockings with T heel. 2.5 cm wide silicone top band, tricot pocket at instep,and oblique top edge and open toe. QUANTITY 1 each leg: custom, knee length, ccl 2, Jobst, RELAX, convoluted hours of sleep device to limit re accumulation of stagnant fluid in limbs during rest , or HOS, to limit fibrosis formation, and to facilitate improved lymphatic and venous functional rest.    Pt donned off the shelf compression pantyhose after session. These are too long for her petite stature and do not control lipo-lymphedema. Cont as per POC.)  OBJECTIVE IMPAIRMENTS: decreased activity tolerance, decreased knowledge of condition, decreased knowledge of use of DME, decreased ROM, increased edema, obesity, pain, and chronic, progressive BLE BLQ swelling and associate pain.   ACTIVITY LIMITATIONS: impaired functional mobility and ambulation ( sitting, standing, squatting, stairs, and bed mobility). Impaired basic and instrumental ADLs ( extended standing, walking and or sitting to prep food, cook, perform shopping, housekeeping, to drive long distances, to fit shoes and lower body clothing, limits sleep), to perform productive activities ( work duties, caring others); Leg pain and swelling limits leisure pursuits  requiring extended sitting, standing and/ or walking; social participation in the community requiring extended standing, sitting and or walking; psychosocial domain: impacts clothing selection due to trying to disguise swelling, limits body image limits intimacy w partner, contributes to depression  PERSONAL FACTORS: Time since onset of injury/illness/exacerbation and 1-3 co  morbidities: RA, varicose veins, Lipedema,  are also affecting patient's  functional outcome.   REHAB POTENTIAL: Good  EVALUATION COMPLEXITY: Moderate  Goals reviewed with patient? Yes   SHORT TERM GOALS: Target date: 4th OT Rx visit    Pt will demonstrate understanding of lymphedema precautions and prevention strategies with modified independence using a printed reference to identify at least 5 precautions and discussing how s/he may implement them into daily life to reduce risk of progression with modified assistance Baseline: max a Goal status: GOAL MET   2. Pt will be able to apply multilayer, knee length, compression wraps using gradient techniques to decrease limb volume, to limit infection risk, and to limit lymphedema progression.   Baseline: Dependent Goal status:GOAL NOT MET to date. Compression wraps will not stay in place and fall down to knees.     LONG TERM GOALS: Target date: 06/10/24      Given this patient's Intake score of 52.94 % on the Lymphedema Life Impact Scale (LLIS), patient will experience a reduction of at least 10 points in the extent to which LE-related problems impact her daily life to improve functional performance and quality of life (QOL) Baseline:52.94% Goal status: ONGOING   2.  Given this patient's Intake score of TBA/100% on the functional outcomes FOTO tool, patient will experience an increase in function of 5 points to improve basic and instrumental ADLs performance, including lymphedema self-care. (TBA at first OT Rx visit) Baseline: max a Goal status: GOAL DISCHARGED. This tool no longer used to assess outcomes in our clinic   3.  With modified independence (extra time and assistive devices) Pt will be able to don and doff appropriate compression garments and/or devices to control BLE lymphedema and to limit progression.  Baseline: Dependent Goal status: ONGOING   4. Pt will achieve at least a 10% volume reductions bilaterally below the knees  to return limb to more typical size and shape, to limit infection risk and LE progression, to decrease pain, to improve function, and to improve body image and QOL. Baseline: Dependent Goal status: ONGOING   5. Pt will achieve and sustain at least 85% attendance at OT sessions, and with compliance with all LE self-care home program components throughout CDT, including modified simple self-MLD, daily skin care and inspection, lymphatic pumping the ex and appropriate compression to limit lymphedema progression and to limit further functional decline. Baseline: Dependent Goal status: GOAL MET and ongoing    PLAN:   OT FREQUENCY: q 2 weeks and PRN to reduce pain and chronic swelling to support ongoing self-management   OT DURATION: 18 weeks and PRN   PLANNED INTERVENTIONS:  Complete Decongestive Therapy (CDT) , intensive and self-management phases: elevation, therapeutic exercise, manual lymphatic drainage (MLD), skin care, COMPRESSION BANDAGING AND GARMENTS) Therapeutic activity, Patient/Family education, Self Care, DME instructions. ongoing  2. Pt should be fit with Custom-made gradient compression garments and HOS devices. These are medically necessary because they are uniquely sized and shaped to fit the exact dimensions of the affected extremities, and to provide appropriate medical grade, graduated compression essential for optimally managing chronic, progressive lymphedema. Multiple custom compression garments are needed to ensure proper hygiene to limit infection risk. Custom compression garments should be replaced q 3-6 months When worn consistently for optimal lipo-lymphedema self-management over time. HOS devices, medically necessary to limit fibrosis buildup in tissue, should be replaced q 2 years and PRN when worn out. 03/13/24: Garments on order  3. Replace basic sequential  vaso-pneumatic pump with advanced Flexitouch Plus sequential pneumatic compression device which includes a  truncal component that mobilizes lip-lymphedema of the trunk, abdomen, hips and buttocks following lymphatic anatomy. The Flexitouch is medically necessary to mobilize lymphedema above the level of the groin nodes towards abdominal lymph nodes and the thoracic duct to reduce infection risk and decrease volume for optimal LE management over time at home. 03/13/24 Trial conducted 03/11/24. New insurance will not authorize per manufacturer rep's advice.   PLAN FOR NEXT SESSION:  MLD Pt edu re LE self care    Zebedee Dec, MS, OTR/L, CLT-LANA 05/06/2024 4:09 PM  "

## 2024-05-20 ENCOUNTER — Ambulatory Visit: Admitting: Occupational Therapy

## 2024-05-20 ENCOUNTER — Encounter: Payer: Self-pay | Admitting: Occupational Therapy

## 2024-05-20 DIAGNOSIS — I890 Lymphedema, not elsewhere classified: Secondary | ICD-10-CM

## 2024-05-20 NOTE — Therapy (Signed)
 " OUTPATIENT OCCUPATIONAL THERAPY TREATMENT NOTE  BILATERAL LOWER EXTREMITY/ BILATERAL LOWER QUADRANT LIPO- LYMPHEDEMA  Patient Name: Victoria Berry MRN: [id number] DOB:10/13/1970, 54 y.o., female Today's Date: 05/20/2024  REPORTING PERIOD:   END OF SESSION:   OT End of Session - 05/20/24 1606     Visit Number 14    Number of Visits 36    Date for Recertification  05/12/24    OT Start Time 0305    OT Stop Time 0405    OT Time Calculation (min) 60 min    Activity Tolerance Patient tolerated treatment well;No increased pain    Behavior During Therapy WFL for tasks assessed/performed            Past Medical History:  Diagnosis Date   GERD (gastroesophageal reflux disease)    Lipidemia    Obesity    RA (rheumatoid arthritis) (HCC)    Past Surgical History:  Procedure Laterality Date   CESAREAN SECTION  2000   twins   COLONOSCOPY WITH PROPOFOL  N/A 06/24/2015   Procedure: COLONOSCOPY WITH PROPOFOL ;  Surgeon: Donnice Vaughn Manes, MD;  Location: [facility] ENDOSCOPY;  Service: Endoscopy;  Laterality: N/A;   ESOPHAGOGASTRODUODENOSCOPY (EGD) WITH PROPOFOL  N/A 06/24/2015   Procedure: ESOPHAGOGASTRODUODENOSCOPY (EGD) WITH PROPOFOL ;  Surgeon: Donnice Vaughn Manes, MD;  Location: [facility] ENDOSCOPY;  Service: Endoscopy;  Laterality: N/A;   right ureter     TONSILLECTOMY     Patient Active Problem List   Diagnosis Date Noted   Kidney cyst, acquired 03/14/2022   Intermittent palpitations 06/03/2019   Pulsatile tinnitus, right ear 12/25/2017   Anxiety state 01/16/2017   Major depression, melancholic type 01/16/2017   Acquired equinus deformity of both feet 10/25/2016   Chronic pain of right ankle 04/25/2016   Morbid obesity with BMI of 45.0-49.9, adult (HCC) 12/21/2015   CRP elevated 07/20/2015   Encounter for long-term (current) use of high-risk medication 04/20/2015   Mild obesity 04/20/2015   Rheumatoid arthritis of multiple sites with negative rheumatoid factor (HCC)  04/20/2015   Macular degeneration, dry 02/12/2014   GERD (gastroesophageal reflux disease) 11/27/2013    PCP: [name] Perfect, PA-C  REFERRING PROVIDER: Orvin Daring, NP  REFERRING DIAG: I89.0  THERAPY DIAG:  Lymphedema, not elsewhere classified  Rationale for Evaluation and Treatment: Rehabilitation  ONSET DATE: Lipedema onset at puberty (+ family hx on both sides)      Lymphedema onset pre menopause ~ 10 yrs ago (2015)  SUBJECTIVE:  SUBJECTIVE STATEMENT: Victoria Berry presents to OT for treatment of BLE lipo-lymphedema. Pt reports pain, described as heaviness in both lower extremities. Pt She reports she continues to follow along with vascular specialist.  Pt and OT discuss latest options for lipedema surgery.  PERTINENT HISTORY:  RA onset 2009 (+ family hx), Lipedema (reported), Obesity - induced obesity (BMI 37.9 today), varicose veins  PAIN:  Are you having pain? Yes: NPRS scale: 6/10 Pain location: knees distally, bilateral ankles Pain description: heaviness, fullness, fatigued Aggravating factors: extended standing and sitting-gravity dependent positioning Relieving factors: elevation, pumps (Biotab)  PRECAUTIONS: Other: LYMPHEDEMA PRECAUTIONS  WEIGHT BEARING RESTRICTIONS: No   FALLS:  Has patient fallen in last 6 months? No  LIVING ENVIRONMENT: Lives with: lives with their spouse Lives in: House/apartment Stairs: Yes; External: 4 steps; can reach both Has following equipment at home: None  OCCUPATION: Dental hygienist-full time  [place]URE: [place]  HAND DOMINANCE: right   PRIOR LEVEL OF FUNCTION: Independent  PATIENT GOALS:  1 Learn how to minimize swelling 2. Work on weight loss 3. Reduce limb volume 4. Reduce discomfort 5. Keep Lymphedema from getting  worse  OBJECTIVE: Note: Objective measures were completed at Evaluation unless otherwise noted.  COGNITION:  Overall cognitive status: Within functional limits for tasks assessed   LYMPHEDEMA OBSERVATIONS / OTHER ASSESSMENTS:   POSTURE: WNL  LE ROM: WNL  BLE COMPARATIVE LIMB VOLUMETRICS  10/23/23 INITIAL  LANDMARK RIGHT  (dominant)  R LEG (A-D) 4045.1 ml  R THIGH (E-G) 7657.4 ml  R FULL LIMB (A-G) 11702.5 ml  Limb Volume differential (LVD)  %  Volume change since initial %  Volume change overall V  (Blank rows = not tested)  LANDMARK LEFT   L LEG (A-D) 4133.6 ml  L THIGH (E-G) 7492.7 ml  L FULL LIMB (A-G) 11676.2 ml  Limb Volume differential (LVD)  LEG Limb Volume Differential (LVD) measures 3.4%, L>R. THIGH LVD measures 2.2 %, R>L, and LVD for full limb measures 0.2%, R>L.   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   Skin  Description Hyper-Keratosis Peau d' Orange Shiny Tight Fibrotic/ Indurated Fatty Doughy Spongy/ boggy       x x x x   Skin dry Flaky WNL Macerated     x    Color Redness Varicosities Blanching Hemosiderin Stain Mottled        x   Odor Malodorous Yeast Fungal infection  WNL      x   Temperature Warm Cool wnl    x     Pitting Edema   1+ 2+ 3+ 4+ Non-pitting         x   Girth Symmetrical Asymmetrical                   Distribution   x  Lower quadrants bilaterally: toes to groin, abdomen, hips, and  buttocks    Stemmer Sign Positive Negative    x   Lymphorrhea History Of:  Present Absent     x    Wounds History Of Present Absent Venous Arterial Pressure Sheer     x        Signs of Infection Redness Warmth Erythema Acute Swelling Drainage Borders                    Sensation Light Touch Deep pressure Hypersensitivity   In tact  Impaired Present In tact Absent Impaired   x  x  x     Nails WNL   Fungus nail dystrophy   x      Hair Growth Symmetrical Asymmetrical   x    Skin Creases Base of toes  Ankles   Base of Fingers knees       Abdominal pannus Thigh Lobules  Face/neck    x    x      LYMPHEDEMA LIFE IMPACT SCALE (LLIS):52.94% (The extent to which LE-related problems impacted your life last week)   PATIENT EDUCATION:  Continued Pt/ CG edu for lymphedema self care home program throughout session. Topics include outcome of comparative limb volumetrics- starting limb volume differentials (LVDs), technology and gradient techniques used for short stretch, multilayer compression wrapping, simple self-MLD, therapeutic lymphatic pumping exercises, skin/nail care, LE precautions, compression garment recommendations and specifications, wear and care schedule and compression garment donning / doffing w assistive devices. Discussed progress towards all OT goals since commencing CDT. Discussed detrimental impact of obesity on lower and upper extremity lymphedema over time. Reviewed OT goals for lymphedema care with Pt and discussed progress to date.  All questions answered to the Pt's satisfaction. Good return. Person educated: Patient  Education method: Explanation, Demonstration, and Handouts Education comprehension: verbalized understanding, returned demonstration, verbal cues required, and needs further education   Pt/ family education for basic and advanced sequential pneumatic compression devices, or pump, including clinical indications, how they work, how the basic device differs from the advanced, clinical indications, contraindications and precautions and care and use routines for pneumatic compression devices. Patient/ caregiver's questions were answered throughout trial and handouts and Internet resources given for reference. FLEXITOUCH PRECAUTIONS: Pt education re precautions related to use of advanced sequential pneumatic compression device. Pt verbalized understanding that she should never use device on  2 arms/legs simultaneously and should not use device 2 x on same day to avoid overloading her heart with fluid volume return both at present, and in years to come should her medical condition change. Pt instructed to remove device     LYMPHEDEMA SELF-CARE HOME PROGRAM: BLE lymphatic pumping there ex- 1 set of 10 each element, in order. Hold 5. 2 x daily 2. Daily, short stretch, thigh length, multilayer compression bandages during Intensive Phase CDT-Deferred 3. During self-Management Phase appropriate thigh high compression stockings paired with compression biker shorts (medical grade TBD) 3. Daily skin care with low ph lotion matching skin ph 4. Daily simple self MLD     ASSESSMENT:   CLINICAL IMPRESSION: Continued MLD to LLE today as established without increased pain.Limb volume is stable and condition is unchanged since last visit. Cont as per POC.  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  Pt has undergone skilled Occupational Therapy for conservative Complete Decongestive Therapy (CDT) 2 x monthly for 5 months with chronic swelling and associated pain persisting. Clinical CDT includes compression bandaging and/ or compression garments, therapeutic lymphatic pumping exercise, skin care to limit infection, manual lymphatic drainage (MLD) and elevation. Pt remains 100% compliant with all self-management home program components. Ms. Ms Berry has used a basic [phone]) Biotab sequential compression pump intermittently nearly 1 year. She no longer uses the device because it causes pain in her legs and feet. This basic device provides squeeze and release compression   from distal to proximal and does not address progressive lymphedema in the BLE, abdomen, buttocks and  hips,  and associated pain and  tissue fibrosis. Today in clinic Pt completed a  trial the advanced ([id number]) Flexitouch sequential pneumatic compression device in the clinic without pain today for a 1-hour using 30-50 mmHg). The Flexitouch is medically necessary to address this patient's chronic, progressive, RLE/RLQ lipo- lymphedema with swelling below the waist and throughout the lower quadrant. The Flexitouch is medically necessary to reduce limb volume and fibrosis, infection risk, and to optimally manage chronic, progressive lymphedema over time at home. )   Provided Pt education throughout session on differences between circular knit and flat knit, off the shelf and custom compression garments, importance of transitions at skin folds instead of bunching at the ankles. Discussed importance of correct fabric selection to accommodate and support lipo-lymphedema. Discussed measuring and fitting processes and role of DME provider. Pt asked good questions and demonstrated understanding of important issues to make informed choices about garment options matching her condition and lifestyle. After skilled education Pt and therapist completed appropriate specifications for medically necessary compression garments for optimal control of  chronic, progressive , Lipo-lymphedema. Custom-made gradient compression garments and HOS devices are medically necessary because they are uniquely sized and shaped to fit the exact dimensions of the affected extremities, and to provide appropriate medical grade, graduated compression essential for optimally managing chronic, progressive lymphedema. Multiple custom compression garments are needed to ensure proper hygiene to limit infection risk. Custom compression garments should be replaced q 3-6 months When worn consistently for optimal lipo-lymphedema self-management over time. HOS devices, medically necessary to limit fibrosis buildup in tissue, should be replaced q 2 years and PRN when worn out.   Pt should be fit with:  QUANTITY 2 for each LEG: BLE, custom ,  Jobst, Elvarex Classic, flat knit ccl 2 ( 23-32 mmHg) knee length compression stockings with T heel. 2.5 cm wide silicone top band, tricot pocket at instep,and oblique top edge and open toe. QUANTITY 1 each leg: custom, knee length, ccl 2, Jobst, RELAX, convoluted hours of sleep device to limit re accumulation of stagnant fluid in limbs during rest , or HOS, to limit fibrosis formation, and to facilitate improved lymphatic and venous functional rest.    Pt donned off the shelf compression pantyhose after session. These are too long for her petite stature and do not control lipo-lymphedema. Cont as per POC.)  OBJECTIVE IMPAIRMENTS: decreased activity tolerance, decreased knowledge of condition, decreased knowledge of use of DME, decreased ROM, increased edema, obesity, pain, and chronic, progressive BLE BLQ swelling and associate pain.   ACTIVITY LIMITATIONS: impaired functional mobility and ambulation ( sitting, standing, squatting, stairs, and bed mobility). Impaired basic and instrumental ADLs ( extended standing, walking and or sitting to prep food, cook, perform shopping, housekeeping, to drive long distances, to fit shoes and lower body clothing, limits sleep), to perform productive activities ( work duties, caring others); Leg pain and swelling limits leisure pursuits  requiring extended sitting, standing and/ or walking; social participation in the community requiring extended standing, sitting and or walking; psychosocial domain: impacts clothing selection due to trying to disguise swelling, limits body image limits intimacy w partner, contributes to depression  PERSONAL FACTORS: Time since onset of injury/illness/exacerbation and 1-3 co morbidities: RA, varicose veins, Lipedema,  are also affecting patient's functional outcome.   REHAB POTENTIAL: Good  EVALUATION COMPLEXITY: Moderate  Goals reviewed with patient? Yes   SHORT TERM GOALS: Target date: 4th OT Rx visit    Pt will demonstrate  understanding of lymphedema precautions and  prevention strategies with modified independence using a printed reference to identify at least 5 precautions and discussing how s/he may implement them into daily life to reduce risk of progression with modified assistance Baseline: max a Goal status: GOAL MET   2. Pt will be able to apply multilayer, knee length, compression wraps using gradient techniques to decrease limb volume, to limit infection risk, and to limit lymphedema progression.   Baseline: Dependent Goal status:GOAL NOT MET to date. Compression wraps will not stay in place and fall down to knees.     LONG TERM GOALS: Target date: 06/10/24      Given this patient's Intake score of 52.94 % on the Lymphedema Life Impact Scale (LLIS), patient will experience a reduction of at least 10 points in the extent to which LE-related problems impact her daily life to improve functional performance and quality of life (QOL) Baseline:52.94% Goal status: ONGOING   2.  Given this patient's Intake score of TBA/100% on the functional outcomes FOTO tool, patient will experience an increase in function of 5 points to improve basic and instrumental ADLs performance, including lymphedema self-care. (TBA at first OT Rx visit) Baseline: max a Goal status: GOAL DISCHARGED. This tool no longer used to assess outcomes in our clinic   3.  With modified independence (extra time and assistive devices) Pt will be able to don and doff appropriate compression garments and/or devices to control BLE lymphedema and to limit progression.  Baseline: Dependent Goal status: ONGOING   4. Pt will achieve at least a 10% volume reductions bilaterally below the knees to return limb to more typical size and shape, to limit infection risk and LE progression, to decrease pain, to improve function, and to improve body image and QOL. Baseline: Dependent Goal status: ONGOING   5. Pt will achieve and sustain at least 85% attendance  at OT sessions, and with compliance with all LE self-care home program components throughout CDT, including modified simple self-MLD, daily skin care and inspection, lymphatic pumping the ex and appropriate compression to limit lymphedema progression and to limit further functional decline. Baseline: Dependent Goal status: GOAL MET and ongoing    PLAN:   OT FREQUENCY: q 2 weeks and PRN to reduce pain and chronic swelling to support ongoing self-management   OT DURATION: 18 weeks and PRN   PLANNED INTERVENTIONS:  Complete Decongestive Therapy (CDT) , intensive and self-management phases: elevation, therapeutic exercise, manual lymphatic drainage (MLD), skin care, COMPRESSION BANDAGING AND GARMENTS) Therapeutic activity, Patient/Family education, Self Care, DME instructions. ongoing  2. Pt should be fit with Custom-made gradient compression garments and HOS devices. These are medically necessary because they are uniquely sized and shaped to fit the exact dimensions of the affected extremities, and to provide appropriate medical grade, graduated compression essential for optimally managing chronic, progressive lymphedema. Multiple custom compression garments are needed to ensure proper hygiene to limit infection risk. Custom compression garments should be replaced q 3-6 months When worn consistently for optimal lipo-lymphedema self-management over time. HOS devices, medically necessary to limit fibrosis buildup in tissue, should be replaced q 2 years and PRN when worn out. 03/13/24: Garments on order  3. Replace basic sequential  vaso-pneumatic pump with advanced Flexitouch Plus sequential pneumatic compression device which includes a truncal component that mobilizes lip-lymphedema of the trunk, abdomen, hips and buttocks following lymphatic anatomy. The Flexitouch is medically necessary to mobilize lymphedema above the level of the groin nodes towards abdominal lymph nodes and the thoracic duct to  reduce infection risk and decrease volume for optimal LE management over time at home. 03/13/24 Trial conducted 03/11/24. New insurance will not authorize per manufacturer rep's advice.   PLAN FOR NEXT SESSION:  MLD Pt edu re LE self care    Zebedee Dec, MS, OTR/L, CLT-LANA 05/20/24 4:08 PM  "

## 2024-06-03 ENCOUNTER — Ambulatory Visit: Attending: Nurse Practitioner | Admitting: Occupational Therapy

## 2024-06-17 ENCOUNTER — Ambulatory Visit: Admitting: Occupational Therapy

## 2024-06-18 ENCOUNTER — Ambulatory Visit: Admitting: Occupational Therapy

## 2024-06-24 ENCOUNTER — Ambulatory Visit: Admitting: Family Medicine

## 2024-06-24 ENCOUNTER — Ambulatory Visit (INDEPENDENT_AMBULATORY_CARE_PROVIDER_SITE_OTHER): Admitting: Nurse Practitioner

## 2024-06-29 ENCOUNTER — Ambulatory Visit: Admitting: Occupational Therapy

## 2024-07-13 ENCOUNTER — Ambulatory Visit: Admitting: Occupational Therapy

## 2024-07-15 ENCOUNTER — Ambulatory Visit: Admitting: Occupational Therapy

## 2024-07-27 ENCOUNTER — Ambulatory Visit: Admitting: Occupational Therapy

## 2024-08-05 ENCOUNTER — Ambulatory Visit: Admitting: Occupational Therapy

## 2024-08-10 ENCOUNTER — Ambulatory Visit: Admitting: Occupational Therapy

## 2024-08-12 ENCOUNTER — Ambulatory Visit: Admitting: Occupational Therapy

## 2024-08-19 ENCOUNTER — Ambulatory Visit: Admitting: Occupational Therapy
